# Patient Record
Sex: Male | Born: 1990 | Race: White | Hispanic: No | Marital: Single | Smoking: Current every day smoker
Health system: Southern US, Community
[De-identification: ages and names within clinical notes are randomized; demographics above are authoritative.]

## PROBLEM LIST (undated history)

## (undated) ENCOUNTER — Emergency Department (HOSPITAL_COMMUNITY): Discharge status: Absent without leave | Payer: Medicaid Other

## (undated) HISTORY — PX: SPLENECTOMY: SUR1306

---
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
## 2012-04-29 ENCOUNTER — Emergency Department (HOSPITAL_COMMUNITY): Payer: Self-pay

## 2012-04-29 ENCOUNTER — Encounter (HOSPITAL_COMMUNITY): Payer: Self-pay

## 2012-04-29 ENCOUNTER — Emergency Department (HOSPITAL_COMMUNITY)
Admission: EM | Admit: 2012-04-29 | Discharge: 2012-04-29 | Disposition: A | Discharge status: Home / Self Care | Payer: Self-pay | Attending: Emergency Medicine | Admitting: Emergency Medicine

## 2012-04-29 DIAGNOSIS — Y92410 Unspecified street and highway as the place of occurrence of the external cause: Secondary | ICD-10-CM | POA: Insufficient documentation

## 2012-04-29 DIAGNOSIS — S62101A Fracture of unspecified carpal bone, right wrist, initial encounter for closed fracture: Secondary | ICD-10-CM

## 2012-04-29 DIAGNOSIS — R000 Tachycardia, unspecified: Secondary | ICD-10-CM | POA: Insufficient documentation

## 2012-04-29 DIAGNOSIS — S52599A Other fractures of lower end of unspecified radius, initial encounter for closed fracture: Secondary | ICD-10-CM | POA: Insufficient documentation

## 2012-04-29 DIAGNOSIS — Y9389 Activity, other specified: Secondary | ICD-10-CM | POA: Insufficient documentation

## 2012-04-29 MED ORDER — HYDROCODONE-ACETAMINOPHEN 5-325 MG PO TABS: ORAL_TABLET | ORAL | Status: DC

## 2012-04-29 MED ORDER — MELOXICAM 7.5 MG PO TABS: 7.5000 mg | ORAL_TABLET | Freq: Every day | ORAL | Status: DC
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
## 2012-04-29 NOTE — ED Notes (Signed)
Patient with no complaints at this time. Respirations even and unlabored. Skin warm/dry. Discharge instructions reviewed with patient at this time. Patient given opportunity to voice concerns/ask questions. Patient discharged at this time and left Emergency Department with steady gait.   
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
## 2012-04-30 NOTE — ED Provider Notes (Signed)
Medical screening examination/treatment/procedure(s) were performed by non-physician practitioner and as supervising physician I was immediately available for consultation/collaboration.   Edsel Shives L Zani Kyllonen, MD 04/30/12 2315 

## 2012-05-01 ENCOUNTER — Ambulatory Visit (INDEPENDENT_AMBULATORY_CARE_PROVIDER_SITE_OTHER): Payer: Self-pay | Admitting: Orthopedic Surgery

## 2012-05-01 VITALS — BP 118/80 | Ht 70.0 in | Wt 163.0 lb

## 2012-05-01 DIAGNOSIS — S52599A Other fractures of lower end of unspecified radius, initial encounter for closed fracture: Secondary | ICD-10-CM

## 2012-05-01 DIAGNOSIS — S52501A Unspecified fracture of the lower end of right radius, initial encounter for closed fracture: Secondary | ICD-10-CM

## 2012-05-01 DIAGNOSIS — S52509A Unspecified fracture of the lower end of unspecified radius, initial encounter for closed fracture: Secondary | ICD-10-CM | POA: Insufficient documentation

## 2012-05-01 MED ORDER — HYDROCODONE-ACETAMINOPHEN 5-325 MG PO TABS: ORAL_TABLET | ORAL | Status: DC

## 2012-05-01 NOTE — Patient Instructions (Signed)
Keep  Cast dry   Do not get wet   If it gets wet dry with a hair dryer on low setting and call the office   

## 2012-05-01 NOTE — Progress Notes (Signed)
Patient ID: Eric Archer, male   DOB: 06-Jun-1990, 22 y.o.   MRN: [id number] Chief Complaint  Patient presents with  . Wrist Pain    Right wrist injury, DOI 04-23-12.    No past medical history on file. No past surgical history on file. History  Substance Use Topics  . Smoking status: Never Smoker   . Smokeless tobacco: Not on file  . Alcohol Use: Yes     Comment: weekly   No family history on file. None allergies none family history diabetes social history single unemployed no previous medical history no surgical history  VS BP 118/80  Ht 5\' 10"  (1.778 m)  Wt 163 lb (73.936 kg)  BMI 23.39 kg/m2  General normal appearance Mood Affect normal AAO X 3  RUE tenderness over the distal radius swelling decreased range of motion joint stable muscle tone normal skin intact LUE normal range of motion strength stability alignment and skin  Lower extremity exam  Ambulation is normal.  The right and left lower extremity:  Inspection and palpation revealed no tenderness or abnormality in alignment in the lower extremities. Range of motion is full.  Strength is grade 5.  All joints are stable.    CDV normal pulse perfusion all 4 extremities LYMPH no lymphadenopathy DTR normal without pathologic reflexes Balance normal  MDM [moderate]  New dx fracture right distal radius  X-ray read as nondisplaced fracture distal radius I interpreted as a nondisplaced fracture reading actually said not sure  Surgery or fracture    The patient  itemized bill with no fracture care, office visit, cast application post materials  Short arm cast applied come back in 4 weeks for x-rays out of plaster

## 2012-05-02 ENCOUNTER — Ambulatory Visit: Payer: Self-pay | Admitting: Orthopedic Surgery

## 2012-05-30 ENCOUNTER — Ambulatory Visit: Payer: Self-pay | Admitting: Orthopedic Surgery

## 2012-06-12 ENCOUNTER — Encounter: Payer: Self-pay | Admitting: Orthopedic Surgery

## 2012-06-12 ENCOUNTER — Ambulatory Visit (INDEPENDENT_AMBULATORY_CARE_PROVIDER_SITE_OTHER): Payer: Self-pay | Admitting: Orthopedic Surgery

## 2012-06-12 ENCOUNTER — Ambulatory Visit (INDEPENDENT_AMBULATORY_CARE_PROVIDER_SITE_OTHER): Payer: Self-pay

## 2012-06-12 VITALS — BP 120/80 | Ht 70.0 in | Wt 163.0 lb

## 2012-06-12 DIAGNOSIS — Z9089 Acquired absence of other organs: Secondary | ICD-10-CM

## 2012-06-12 DIAGNOSIS — S62101D Fracture of unspecified carpal bone, right wrist, subsequent encounter for fracture with routine healing: Secondary | ICD-10-CM

## 2012-06-12 DIAGNOSIS — S62109A Fracture of unspecified carpal bone, unspecified wrist, initial encounter for closed fracture: Secondary | ICD-10-CM | POA: Insufficient documentation

## 2012-06-12 DIAGNOSIS — S5290XD Unspecified fracture of unspecified forearm, subsequent encounter for closed fracture with routine healing: Secondary | ICD-10-CM

## 2012-06-12 DIAGNOSIS — Z9081 Acquired absence of spleen: Secondary | ICD-10-CM | POA: Insufficient documentation

## 2012-06-12 MED ORDER — OXYCODONE-ACETAMINOPHEN 7.5-325 MG PO TABS: 1.0000 | ORAL_TABLET | ORAL | Status: DC | PRN

## 2012-06-12 NOTE — Progress Notes (Signed)
Patient ID: Eric Archer, male   DOB: 07-May-1990, 22 y.o.   MRN: [id number] Chief Complaint  Patient presents with  . 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 He is need some pain medicine for that. He see her for out of plaster x-ray which shows fracture healing distal radius  Followup as needed

## 2012-06-12 NOTE — Patient Instructions (Addendum)
Gradually resume normal activities

## 2013-07-18 ENCOUNTER — Emergency Department (HOSPITAL_COMMUNITY): Payer: Medicaid Other

## 2013-07-18 ENCOUNTER — Encounter (HOSPITAL_COMMUNITY): Payer: Self-pay | Admitting: Emergency Medicine

## 2013-07-18 ENCOUNTER — Emergency Department (HOSPITAL_COMMUNITY)
Admission: EM | Admit: 2013-07-18 | Discharge: 2013-07-18 | Disposition: A | Discharge status: Home / Self Care | Payer: Medicaid Other | Attending: Emergency Medicine | Admitting: Emergency Medicine

## 2013-07-18 DIAGNOSIS — Y9367 Activity, basketball: Secondary | ICD-10-CM | POA: Insufficient documentation

## 2013-07-18 DIAGNOSIS — S20211A Contusion of right front wall of thorax, initial encounter: Secondary | ICD-10-CM

## 2013-07-18 DIAGNOSIS — Z791 Long term (current) use of non-steroidal anti-inflammatories (NSAID): Secondary | ICD-10-CM | POA: Insufficient documentation

## 2013-07-18 DIAGNOSIS — Y92838 Other recreation area as the place of occurrence of the external cause: Secondary | ICD-10-CM

## 2013-07-18 DIAGNOSIS — Y9239 Other specified sports and athletic area as the place of occurrence of the external cause: Secondary | ICD-10-CM | POA: Insufficient documentation

## 2013-07-18 DIAGNOSIS — S20219A Contusion of unspecified front wall of thorax, initial encounter: Secondary | ICD-10-CM | POA: Insufficient documentation

## 2013-07-18 DIAGNOSIS — F17200 Nicotine dependence, unspecified, uncomplicated: Secondary | ICD-10-CM | POA: Insufficient documentation

## 2013-07-18 DIAGNOSIS — IMO0002 Reserved for concepts with insufficient information to code with codable children: Secondary | ICD-10-CM | POA: Insufficient documentation

## 2013-07-18 DIAGNOSIS — R296 Repeated falls: Secondary | ICD-10-CM | POA: Insufficient documentation

## 2013-07-18 DIAGNOSIS — Z79899 Other long term (current) drug therapy: Secondary | ICD-10-CM | POA: Insufficient documentation

## 2013-07-18 MED ORDER — IBUPROFEN 800 MG PO TABS: 800.0000 mg | ORAL_TABLET | Freq: Three times a day (TID) | ORAL | Status: DC

## 2013-07-18 MED ORDER — HYDROCODONE-ACETAMINOPHEN 5-325 MG PO TABS: ORAL_TABLET | ORAL | Status: DC

## 2013-07-18 NOTE — Discharge Instructions (Signed)
Chest Contusion °A contusion is a deep bruise. Bruises happen when an injury causes bleeding under the skin. Signs of bruising include pain, puffiness (swelling), and discolored skin. The bruise may turn blue, purple, or yellow.  °HOME CARE °· Put ice on the injured area. °¨ Put ice in a plastic bag. °¨ Place a towel between the skin and the bag. °¨ Leave the ice on for 15-20 minutes at a time, 03-04 times a day for the first 48 hours. °· Only take medicine as told by your doctor. °· Rest. °· Take deep breaths (deep-breathing exercises) as told by your doctor. °· Stop smoking if you smoke. °· Do not lift objects over 5 pounds (2.3 kilograms) for 3 days or longer if told by your doctor. °GET HELP RIGHT AWAY IF:  °· You have more bruising or puffiness. °· You have pain that gets worse. °· You have trouble breathing. °· You are dizzy, weak, or pass out (faint). °· You have blood in your pee (urine) or poop (stool). °· You cough up or throw up (vomit) blood. °· Your puffiness or pain is not helped with medicines. °MAKE SURE YOU:  °· Understand these instructions. °· Will watch your condition. °· Will get help right away if you are not doing well or get worse. °Document Released: 06/30/2007 Document Revised: 10/06/2011 Document Reviewed: 07/05/2011 °ExitCare® Patient Information ©2015 [employer]. This information is not intended to replace advice given to you by your health care provider. Make sure you discuss any questions you have with your health care provider. ° °

## 2013-07-18 NOTE — ED Notes (Signed)
Pt refusing x-ray, stating "I just want to keep the bill down."  Pt just requesting physical by MD and doctor's note for work.

## 2013-07-18 NOTE — ED Notes (Signed)
Pt reports to the ED with complaint of right sided flank pain r/t falling on his side this past Sunday while playing basketball. Pt reports falling onto the ball.

## 2013-07-21 NOTE — ED Provider Notes (Signed)
CSN: [id number]     Arrival date & time 07/18/13  1821 History   First MD Initiated Contact with Patient 07/18/13 1959     Chief Complaint  Patient presents with  . Flank Pain     (Consider location/radiation/quality/duration/timing/severity/associated sxs/prior Treatment) Patient is a 23 y.o. male presenting with flank pain. The history is provided by the patient.  Flank Pain This is a new problem. The current episode started in the past 7 days. The problem occurs constantly. The problem has been unchanged. Associated symptoms include arthralgias, chest pain and joint swelling. Pertinent negatives include no abdominal pain, chills, congestion, coughing, diaphoresis, fever, headaches, nausea, neck pain, numbness, rash, swollen glands, urinary symptoms, vomiting or weakness. The symptoms are aggravated by bending, twisting and standing. He has tried nothing for the symptoms. The treatment provided no relief.   patient reports localized tenderness to the lateral right ribs.  Pain began after a mechanical fall, landed on a ball.  He states the pain is worse with movement and deep breathing.  Resolves when at rest.  He denies shortness of breath, abdominal pain, hematuria, dysuria, fever or vomiting.   History reviewed. No pertinent past medical history. Past Surgical History  Procedure Laterality Date  . Splenectomy     History reviewed. No pertinent family history. History  Substance Use Topics  . Smoking status: Current Every Day Smoker -- 0.50 packs/day    Types: Cigarettes  . Smokeless tobacco: Not on file  . Alcohol Use: Yes     Comment: weekly    Review of Systems  Constitutional: Negative for fever, chills and diaphoresis.  HENT: Negative for congestion.   Respiratory: Negative for cough, chest tightness and shortness of breath.   Cardiovascular: Positive for chest pain.  Gastrointestinal: Negative for nausea, vomiting and abdominal pain.  Genitourinary: Positive for flank  pain. Negative for dysuria, hematuria and difficulty urinating.  Musculoskeletal: Positive for arthralgias and joint swelling. Negative for back pain and neck pain.  Skin: Negative for color change, rash and wound.  Neurological: Negative for dizziness, weakness, numbness and headaches.  All other systems reviewed and are negative.     Allergies  Review of patient's allergies indicates no known allergies.  Home Medications   Prior to Admission medications   Medication Sig Start Date End Date Taking? Authorizing Provider  HYDROcodone-acetaminophen (NORCO/VICODIN) 5-325 MG per tablet 1 or 2 tabs q4h prn pain 05/01/12   Vickki HearingStanley E Harrison, MD  HYDROcodone-acetaminophen (NORCO/VICODIN) 5-325 MG per tablet Take one-two tabs po q 4-6 hrs prn pain 07/18/13   Tammy L. Triplett, PA-C  ibuprofen (ADVIL,MOTRIN) 800 MG tablet Take 1 tablet (800 mg total) by mouth 3 (three) times daily. Take with food 07/18/13   Tammy L. Triplett, PA-C  meloxicam (MOBIC) 7.5 MG tablet Take 1 tablet (7.5 mg total) by mouth daily. 04/29/12   Kathie DikeHobson M Bryant, PA-C  oxyCODONE-acetaminophen (PERCOCET) 7.5-325 MG per tablet Take 1 tablet by mouth every 4 (four) hours as needed for pain. 06/12/12   Vickki HearingStanley E Harrison, MD   BP 127/77  Pulse 110  Temp(Src) 98.2 F (36.8 C) (Oral)  Resp 16  Ht 5\' 10"  (1.778 m)  Wt 160 lb (72.576 kg)  BMI 22.96 kg/m2  SpO2 98% Physical Exam  Nursing note and vitals reviewed. Constitutional: He is oriented to person, place, and time. He appears well-developed and well-nourished. No distress.  HENT:  Head: Normocephalic and atraumatic.  Neck: Normal range of motion. Neck supple.  Cardiovascular: Normal rate, regular  rhythm, normal heart sounds and intact distal pulses.   No murmur heard. Pulmonary/Chest: Effort normal and breath sounds normal. No respiratory distress. He has no wheezes. He has no rales. He exhibits tenderness.  Localized ttp of the right lateral ribs.  No edema, bruising or  crepitus.    Abdominal: Soft. Bowel sounds are normal. He exhibits no distension and no mass. There is no tenderness. There is no rebound and no guarding.  Musculoskeletal: Normal range of motion.  Lymphadenopathy:    He has no cervical adenopathy.  Neurological: He is alert and oriented to person, place, and time. He exhibits normal muscle tone. Coordination normal.  Skin: Skin is warm and dry.    ED Course  Procedures (including critical care time) Labs Review Labs Reviewed - No data to display  Imaging Review No results found.   EKG Interpretation None      MDM   Final diagnoses:  Contusion of right chest wall, initial encounter    Rib film ordered by nursing, but pt refused.  Stating that he is trying to keep his cost down.  Lung sounds are CTA, pt ambulates with steady gait.  No guarding or abdominal pain on exam, no dyspnea.  Pt is well appearing.  He agrees to symptomatic treatment with vicodin and ibuprofen.  I have advised him to f/u with Dr. Romeo AppleHarrison for recheck or to return here for any sudden, worsening symptoms.  Pt agrees to care plan and verbalized understanding.      Tammy L. Trisha Mangleriplett, PA-C 07/21/13 2058

## 2013-07-22 NOTE — ED Provider Notes (Signed)
Medical screening examination/treatment/procedure(s) were performed by non-physician practitioner and as supervising physician I was immediately available for consultation/collaboration.   EKG Interpretation None        Layla MawKristen N Ward, DO 07/22/13 [phone]

## 2013-08-28 ENCOUNTER — Emergency Department (HOSPITAL_COMMUNITY)
Admission: EM | Admit: 2013-08-28 | Discharge: 2013-08-29 | Disposition: A | Discharge status: Home / Self Care | Payer: Medicaid Other | Attending: Emergency Medicine | Admitting: Emergency Medicine

## 2013-08-28 ENCOUNTER — Encounter (HOSPITAL_COMMUNITY): Payer: Self-pay | Admitting: Emergency Medicine

## 2013-08-28 ENCOUNTER — Emergency Department (HOSPITAL_COMMUNITY): Payer: Medicaid Other

## 2013-08-28 DIAGNOSIS — IMO0002 Reserved for concepts with insufficient information to code with codable children: Secondary | ICD-10-CM | POA: Diagnosis not present

## 2013-08-28 DIAGNOSIS — Z79899 Other long term (current) drug therapy: Secondary | ICD-10-CM | POA: Diagnosis not present

## 2013-08-28 DIAGNOSIS — M5412 Radiculopathy, cervical region: Secondary | ICD-10-CM | POA: Diagnosis not present

## 2013-08-28 DIAGNOSIS — M542 Cervicalgia: Secondary | ICD-10-CM | POA: Diagnosis present

## 2013-08-28 DIAGNOSIS — Z791 Long term (current) use of non-steroidal anti-inflammatories (NSAID): Secondary | ICD-10-CM | POA: Diagnosis not present

## 2013-08-28 DIAGNOSIS — M62838 Other muscle spasm: Secondary | ICD-10-CM | POA: Diagnosis not present

## 2013-08-28 DIAGNOSIS — F17200 Nicotine dependence, unspecified, uncomplicated: Secondary | ICD-10-CM | POA: Insufficient documentation

## 2013-08-28 NOTE — ED Notes (Signed)
Pain post neck with pain down rt arm with numbness of rt hand for 6 days, No know injury

## 2013-08-29 MED ORDER — PREDNISONE 10 MG PO TABS: ORAL_TABLET | ORAL | Status: DC

## 2013-08-29 MED ORDER — METHOCARBAMOL 500 MG PO TABS
1000.0000 mg | ORAL_TABLET | Freq: Once | ORAL | Status: AC
  Administered 2013-08-29: 1000 mg via ORAL
  Filled 2013-08-29: qty 2

## 2013-08-29 MED ORDER — PREDNISONE 50 MG PO TABS
60.0000 mg | ORAL_TABLET | Freq: Once | ORAL | Status: AC
  Administered 2013-08-29: 60 mg via ORAL
  Filled 2013-08-29 (×2): qty 1

## 2013-08-29 MED ORDER — METHOCARBAMOL 500 MG PO TABS: 1000.0000 mg | ORAL_TABLET | Freq: Four times a day (QID) | ORAL | Status: AC

## 2013-08-29 NOTE — ED Notes (Signed)
Patient given discharge instruction, verbalized understand. Patient ambulatory out of the department.  

## 2013-08-29 NOTE — Discharge Instructions (Signed)
Cervical Radiculopathy Cervical radiculopathy happens when a nerve in the neck is pinched or bruised by a slipped (herniated) disk or by arthritic changes in the bones of the cervical spine. This can occur due to an injury or as part of the normal aging process. Pressure on the cervical nerves can cause pain or numbness that runs from your neck all the way down into your arm and fingers. CAUSES  There are many possible causes, including:  Injury.  Muscle tightness in the neck from overuse.  Swollen, painful joints (arthritis).  Breakdown or degeneration in the bones and joints of the spine (spondylosis) due to aging.  Bone spurs that may develop near the cervical nerves. SYMPTOMS  Symptoms include pain, weakness, or numbness in the affected arm and hand. Pain can be severe or irritating. Symptoms may be worse when extending or turning the neck. DIAGNOSIS  Your caregiver will ask about your symptoms and do a physical exam. He or she may test your strength and reflexes. X-rays, CT scans, and MRI scans may be needed in cases of injury or if the symptoms do not go away after a period of time. Electromyography (EMG) or nerve conduction testing may be done to study how your nerves and muscles are working. TREATMENT  Your caregiver may recommend certain exercises to help relieve your symptoms. Cervical radiculopathy can, and often does, get better with time and treatment. If your problems continue, treatment options may include:  Wearing a soft collar for short periods of time.  Physical therapy to strengthen the neck muscles.  Medicines, such as nonsteroidal anti-inflammatory drugs (NSAIDs), oral corticosteroids, or spinal injections.  Surgery. Different types of surgery may be done depending on the cause of your problems. HOME CARE INSTRUCTIONS   Put ice on the affected area.  Put ice in a plastic bag.  Place a towel between your skin and the bag.  Leave the ice on for 15-20 minutes,  03-04 times a day or as directed by your caregiver.  If ice does not help, you can try using heat. Take a warm shower or bath, or use a hot water bottle as directed by your caregiver.  You may try a gentle neck and shoulder massage.  Use a flat pillow when you sleep.  Only take over-the-counter or prescription medicines for pain, discomfort, or fever as directed by your caregiver.  If physical therapy was prescribed, follow your caregiver's directions.  If a soft collar was prescribed, use it as directed. SEEK IMMEDIATE MEDICAL CARE IF:   Your pain gets much worse and cannot be controlled with medicines.  You have weakness or numbness in your hand, arm, face, or leg.  You have a high fever or a stiff, rigid neck.  You lose bowel or bladder control (incontinence).  You have trouble with walking, balance, or speaking. MAKE SURE YOU:   Understand these instructions.  Will watch your condition.  Will get help right away if you are not doing well or get worse. Document Released: 10/06/2000 Document Revised: 04/05/2011 Document Reviewed: 08/25/2010 [employer] Patient Information 2015 [employer], [place]. This information is not intended to replace advice given to you by your health care provider. Make sure you discuss any questions you have with your health care provider.   Take your next dose of prednisone tomorrow evening.  Apply a heating pad (or hot shower) 15 minutes 3 times daily to help relax your shoulder and neck muscles.
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
## 2013-09-01 NOTE — ED Provider Notes (Signed)
Medical screening examination/treatment/procedure(s) were performed by non-physician practitioner and as supervising physician I was immediately available for consultation/collaboration.   EKG Interpretation None        Keylee Shrestha M Breylin Dom, MD 09/01/13 1105 

## 2014-12-10 ENCOUNTER — Emergency Department (HOSPITAL_COMMUNITY)
Admission: EM | Admit: 2014-12-10 | Discharge: 2014-12-10 | Disposition: A | Discharge status: Home / Self Care | Payer: Medicaid Other | Attending: Emergency Medicine | Admitting: Emergency Medicine

## 2014-12-10 ENCOUNTER — Encounter (HOSPITAL_COMMUNITY): Payer: Self-pay | Admitting: Emergency Medicine

## 2014-12-10 DIAGNOSIS — R6884 Jaw pain: Secondary | ICD-10-CM | POA: Insufficient documentation

## 2014-12-10 DIAGNOSIS — R531 Weakness: Secondary | ICD-10-CM | POA: Insufficient documentation

## 2014-12-10 DIAGNOSIS — M542 Cervicalgia: Secondary | ICD-10-CM | POA: Insufficient documentation

## 2014-12-10 DIAGNOSIS — F17210 Nicotine dependence, cigarettes, uncomplicated: Secondary | ICD-10-CM | POA: Insufficient documentation

## 2014-12-10 DIAGNOSIS — Z791 Long term (current) use of non-steroidal anti-inflammatories (NSAID): Secondary | ICD-10-CM | POA: Insufficient documentation

## 2014-12-10 DIAGNOSIS — G510 Bell's palsy: Secondary | ICD-10-CM | POA: Insufficient documentation

## 2014-12-10 LAB — CBC WITH DIFFERENTIAL/PLATELET
BASOS PCT: 0 %
Basophils Absolute: 0 10*3/uL (ref 0.0–0.1)
EOS ABS: 0.1 10*3/uL (ref 0.0–0.7)
Eosinophils Relative: 1 %
HCT: 42.6 % (ref 39.0–52.0)
HEMOGLOBIN: 14.9 g/dL (ref 13.0–17.0)
LYMPHS ABS: 5.3 10*3/uL — AB (ref 0.7–4.0)
Lymphocytes Relative: 49 %
MCH: 31.4 pg (ref 26.0–34.0)
MCHC: 35 g/dL (ref 30.0–36.0)
MCV: 89.7 fL (ref 78.0–100.0)
MONO ABS: 1.1 10*3/uL — AB (ref 0.1–1.0)
MONOS PCT: 10 %
NEUTROS PCT: 40 %
Neutro Abs: 4.3 10*3/uL (ref 1.7–7.7)
Platelets: 481 10*3/uL — ABNORMAL HIGH (ref 150–400)
RBC: 4.75 MIL/uL (ref 4.22–5.81)
RDW: 13 % (ref 11.5–15.5)
WBC: 10.9 10*3/uL — ABNORMAL HIGH (ref 4.0–10.5)

## 2014-12-10 LAB — BASIC METABOLIC PANEL
Anion gap: 10 (ref 5–15)
BUN: 10 mg/dL (ref 6–20)
CALCIUM: 9.6 mg/dL (ref 8.9–10.3)
CHLORIDE: 101 mmol/L (ref 101–111)
CO2: 28 mmol/L (ref 22–32)
CREATININE: 0.87 mg/dL (ref 0.61–1.24)
GFR calc non Af Amer: >60 mL/min (ref >60)
Glucose, Bld: 103 mg/dL — ABNORMAL HIGH (ref 65–99)
Potassium: 3.8 mmol/L (ref 3.5–5.1)
SODIUM: 139 mmol/L (ref 135–145)

## 2014-12-10 MED ORDER — ARTIFICIAL TEARS OP OINT
TOPICAL_OINTMENT | OPHTHALMIC | Status: AC
  Filled 2014-12-10: qty 3.5

## 2014-12-10 MED ORDER — ARTIFICIAL TEARS OP OINT
TOPICAL_OINTMENT | OPHTHALMIC | Status: DC | PRN
  Administered 2014-12-10: 18:00:00 via OPHTHALMIC
  Filled 2014-12-10: qty 3.5

## 2014-12-10 MED ORDER — VALACYCLOVIR HCL 1 G PO TABS: 1000.0000 mg | ORAL_TABLET | Freq: Three times a day (TID) | ORAL | Status: DC

## 2014-12-10 MED ORDER — PREDNISONE 50 MG PO TABS
60.0000 mg | ORAL_TABLET | Freq: Once | ORAL | Status: AC
  Administered 2014-12-10: 60 mg via ORAL
  Filled 2014-12-10 (×2): qty 1

## 2014-12-10 MED ORDER — PREDNISONE 20 MG PO TABS: 40.0000 mg | ORAL_TABLET | Freq: Every day | ORAL | Status: DC

## 2014-12-10 MED ORDER — VALACYCLOVIR HCL 500 MG PO TABS
1000.0000 mg | ORAL_TABLET | Freq: Once | ORAL | Status: AC
  Administered 2014-12-10: 1000 mg via ORAL
  Filled 2014-12-10: qty 2

## 2014-12-10 NOTE — ED Notes (Signed)
Pt states that he was sent from the health dept for evaluation of left sided facial swelling and numbness.  Also c/o pain just behind jaw.

## 2014-12-10 NOTE — Discharge Instructions (Signed)
Bell Palsy °Bell palsy is a condition in which the muscles on one side of the face become paralyzed. This often causes one side of the face to droop. It is a common condition and most people recover completely. °RISK FACTORS °Risk factors for Bell palsy include: °· Pregnancy. °· Diabetes. °· An infection by a virus, such as infections that cause cold sores. °CAUSES  °Bell palsy is caused by damage to or inflammation of a nerve in your face. It is unclear why this happens, but an infection by a virus may lead to it. Most of the time the reason it happens is unknown. °SIGNS AND SYMPTOMS  °Symptoms can range from mild to severe and can take place over a number of hours. Symptoms may include: °· Being unable to: °¨ Raise one or both eyebrows. °¨ Close one or both eyes. °¨ Feel parts of your face (facial numbness). °· Drooping of the eyelid and corner of the mouth. °· Weakness in the face. °· Paralysis of half your face. °· Loss of taste. °· Sensitivity to loud noises. °· Difficulty chewing. °· Tearing up of the affected eye. °· Dryness in the affected eye. °· Drooling. °· Pain behind one ear. °DIAGNOSIS  °Diagnosis of Bell palsy may include: °· A medical history and physical exam. °· An MRI. °· A CT scan. °· Electromyography (EMG). This is a test that checks how your nerves are working. °TREATMENT  °Treatment may include antiviral medicine to help shorten the length of the condition. Sometimes treatment is not needed and the symptoms go away on their own. °HOME CARE INSTRUCTIONS  °· Take medicines only as directed by your health care provider. °· Do facial massages and exercises as directed by your health care provider. °· If your eye is affected: °¨ Use moisturizing eye drops to prevent drying of your eye as directed by your health care provider. °¨ Protect your eye as directed by your health care provider. °SEEK MEDICAL CARE IF: °· Your symptoms do not get better or get worse. °· You are drooling. °· Your eye is red,  irritated, or hurts. °SEEK IMMEDIATE MEDICAL CARE IF:  °· Another part of your body feels weak or numb. °· You have difficulty swallowing. °· You have a fever along with symptoms of Bell palsy. °· You develop neck pain. °MAKE SURE YOU:  °· Understand these instructions. °· Will watch your condition. °· Will get help right away if you are not doing well or get worse. °  °This information is not intended to replace advice given to you by your health care provider. Make sure you discuss any questions you have with your health care provider. °  °Document Released: 01/11/2005 Document Revised: 10/02/2014 Document Reviewed: 04/20/2013 °Elsevier Interactive Patient Education ©2016 [employer] ° °

## 2014-12-10 NOTE — ED Provider Notes (Signed)
CSN: [id number]     Arrival date & time 12/10/14  1517 History   First MD Initiated Contact with Patient 12/10/14 1743     Chief Complaint  Patient presents with  . Facial Swelling     (Consider location/radiation/quality/duration/timing/severity/associated sxs/prior Treatment) The history is provided by the patient.   patient presents with difficulty moving the left side of his face. Began last night. Some mild pain in his left neck. States he had some drooling earlier today. States he feels as if there is a mild pain behind his left jaw. States he does have a bad tooth in his left lower jaw. No vision changes. States that around 2 weeks ago he did have a viral infection nausea and vomiting. States that around this time each year he also gets ulcers in his mouth.   History reviewed. No pertinent past medical history. Past Surgical History  Procedure Laterality Date  . Splenectomy     History reviewed. No pertinent family history. Social History  Substance Use Topics  . Smoking status: Current Every Day Smoker -- 0.50 packs/day    Types: Cigarettes  . Smokeless tobacco: None  . Alcohol Use: Yes     Comment: weekly    Review of Systems  Constitutional: Negative for activity change and appetite change.  Eyes: Negative for pain.  Respiratory: Negative for choking, chest tightness and shortness of breath.   Cardiovascular: Negative for chest pain and leg swelling.  Gastrointestinal: Negative for nausea, vomiting, abdominal pain and diarrhea.  Genitourinary: Negative for flank pain.  Musculoskeletal: Negative for back pain and neck stiffness.  Skin: Negative for rash.  Neurological: Positive for weakness. Negative for numbness and headaches.  Psychiatric/Behavioral: Negative for behavioral problems.      Allergies  Review of patient's allergies indicates no known allergies.  Home Medications   Prior to Admission medications   Medication Sig Start Date End Date Taking?  Authorizing Provider  HYDROcodone-acetaminophen (NORCO/VICODIN) 5-325 MG per tablet 1 or 2 tabs q4h prn pain 05/01/12   Vickki Hearing, MD  HYDROcodone-acetaminophen (NORCO/VICODIN) 5-325 MG per tablet Take one-two tabs po q 4-6 hrs prn pain 07/18/13   Tammy Triplett, PA-C  ibuprofen (ADVIL,MOTRIN) 800 MG tablet Take 1 tablet (800 mg total) by mouth 3 (three) times daily. Take with food 07/18/13   Tammy Triplett, PA-C  meloxicam (MOBIC) 7.5 MG tablet Take 1 tablet (7.5 mg total) by mouth daily. 04/29/12   Ivery Quale, PA-C  oxyCODONE-acetaminophen (PERCOCET) 7.5-325 MG per tablet Take 1 tablet by mouth every 4 (four) hours as needed for pain. 06/12/12   Vickki Hearing, MD  predniSONE (DELTASONE) 20 MG tablet Take 2 tablets (40 mg total) by mouth daily. 12/10/14   Benjiman Core, MD  valACYclovir (VALTREX) 1000 MG tablet Take 1 tablet (1,000 mg total) by mouth 3 (three) times daily. 12/10/14   Benjiman Core, MD   BP 130/87 mmHg  Pulse 97  Temp(Src) 98.2 F (36.8 C) (Oral)  Resp 18  Ht  (1.778 m)  Wt 178 lb (80.74 kg)  BMI 25.54 kg/m2  SpO2 100% Physical Exam  Constitutional: He appears well-developed and well-nourished.  HENT:  Head: Atraumatic.  Eyes: EOM are normal. Pupils are equal, round, and reactive to light.  Neck: Neck supple.  Mild tenderness behind jaw on left side.  Cardiovascular: Normal rate.   Pulmonary/Chest: Effort normal.  Abdominal: Soft.  Neurological: He is alert.  A left-sided facial droop. Forehead is involved. External ocular movements intact. Difficulty  closing the left eye.    ED Course  Procedures (including critical care time) Labs Review Labs Reviewed  CBC WITH DIFFERENTIAL/PLATELET - Abnormal; Notable for the following:    WBC 10.9 (*)    Platelets 481 (*)    Lymphs Abs 5.3 (*)    Monocytes Absolute 1.1 (*)    All other components within normal limits  BASIC METABOLIC PANEL - Abnormal; Notable for the following:    Glucose, Bld 103  (*)    All other components within normal limits    Imaging Review No results found. I have personally reviewed and evaluated these images and lab results as part of my medical decision-making.   EKG Interpretation None      MDM   Final diagnoses:  Bell's palsy   patient with most likely Bell's palsy. Left facial droop with forehead involvement. This time year reportedly always gets ulcers in his mouth although he does not have now. Will treat with valacyclovir and sterile it's. Follow-up as needed. Other central cause of facial droop is less likely.    Benjiman CoreNathan Jovan Colligan, MD 12/10/14 [phone]

## 2015-06-09 ENCOUNTER — Encounter (HOSPITAL_COMMUNITY): Payer: Self-pay

## 2015-06-09 ENCOUNTER — Emergency Department (HOSPITAL_COMMUNITY)
Admission: EM | Admit: 2015-06-09 | Discharge: 2015-06-09 | Disposition: A | Discharge status: Home / Self Care | Payer: Medicaid Other | Attending: Emergency Medicine | Admitting: Emergency Medicine

## 2015-06-09 DIAGNOSIS — Y939 Activity, unspecified: Secondary | ICD-10-CM | POA: Insufficient documentation

## 2015-06-09 DIAGNOSIS — F17210 Nicotine dependence, cigarettes, uncomplicated: Secondary | ICD-10-CM | POA: Insufficient documentation

## 2015-06-09 DIAGNOSIS — W57XXXA Bitten or stung by nonvenomous insect and other nonvenomous arthropods, initial encounter: Secondary | ICD-10-CM | POA: Insufficient documentation

## 2015-06-09 DIAGNOSIS — Y999 Unspecified external cause status: Secondary | ICD-10-CM | POA: Insufficient documentation

## 2015-06-09 DIAGNOSIS — S80861A Insect bite (nonvenomous), right lower leg, initial encounter: Secondary | ICD-10-CM | POA: Insufficient documentation

## 2015-06-09 DIAGNOSIS — Y929 Unspecified place or not applicable: Secondary | ICD-10-CM | POA: Insufficient documentation

## 2015-06-09 DIAGNOSIS — R112 Nausea with vomiting, unspecified: Secondary | ICD-10-CM | POA: Insufficient documentation

## 2015-06-09 MED ORDER — ONDANSETRON HCL 4 MG PO TABS
4.0000 mg | ORAL_TABLET | Freq: Once | ORAL | Status: AC
Start: 2015-06-09 — End: 2015-06-09
  Administered 2015-06-09: 4 mg via ORAL
  Filled 2015-06-09: qty 1

## 2015-06-09 MED ORDER — ACETAMINOPHEN-CODEINE #3 300-30 MG PO TABS: 1.0000 | ORAL_TABLET | Freq: Four times a day (QID) | ORAL | Status: DC | PRN

## 2015-06-09 MED ORDER — IBUPROFEN 800 MG PO TABS
800.0000 mg | ORAL_TABLET | Freq: Once | ORAL | Status: AC
Start: 2015-06-09 — End: 2015-06-09
  Administered 2015-06-09: 800 mg via ORAL
  Filled 2015-06-09: qty 1

## 2015-06-09 MED ORDER — PROMETHAZINE HCL 12.5 MG PO TABS: 12.5000 mg | ORAL_TABLET | Freq: Four times a day (QID) | ORAL | Status: DC | PRN

## 2015-06-09 MED ORDER — SULFAMETHOXAZOLE-TRIMETHOPRIM 800-160 MG PO TABS
1.0000 | ORAL_TABLET | Freq: Once | ORAL | Status: AC
  Administered 2015-06-09: 1 via ORAL
  Filled 2015-06-09: qty 1

## 2015-06-09 MED ORDER — SULFAMETHOXAZOLE-TRIMETHOPRIM 800-160 MG PO TABS: 1.0000 | ORAL_TABLET | Freq: Two times a day (BID) | ORAL | Status: AC

## 2015-06-09 NOTE — ED Provider Notes (Signed)
CSN: [id number]     Arrival date & time 06/09/15  1159 History  By signing my name below, I, Placido SouLogan Joldersma, attest that this documentation has been prepared under the direction and in the presence of Ivery QualeHobson Walt Geathers, PA-C. Electronically Signed: Placido SouLogan Joldersma, ED Scribe. 06/09/2015. 2:12 PM.   Chief Complaint  Patient presents with  . Insect Bite   Patient is a 25 y.o. male presenting with animal bite. The history is provided by the patient. No language interpreter was used.  Animal Bite Contact animal:  Insect Location:  Leg Leg injury location:  R lower leg Time since incident:  2 days Pain details:    Severity:  Mild   Timing:  Constant   Progression:  Unchanged Incident location:  Another residence Relieved by:  None tried Ineffective treatments:  None tried Associated symptoms: no fever and no rash     HPI Comments: Eric Archer is a 25 y.o. male who presents to the Emergency Department complaining of a possible insect bite to his right anterior calf x 2 days. Pt is unsure if he was bit by an insect noting that he first saw the affected region after sleeping on an air mattress at a friend's home. He reports associated, intermittent, mild, n/v, decreased appetite and mild, diffuse, body aches. Pt denies fevers.   History reviewed. No pertinent past medical history. Past Surgical History  Procedure Laterality Date  . Splenectomy     No family history on file. Social History  Substance Use Topics  . Smoking status: Current Every Day Smoker -- 0.50 packs/day    Types: Cigarettes  . Smokeless tobacco: None  . Alcohol Use: Yes     Comment: weekly    Review of Systems  Constitutional: Positive for appetite change. Negative for fever.  Gastrointestinal: Positive for nausea and vomiting.  Musculoskeletal: Positive for myalgias.  Skin: Positive for wound. Negative for rash.  All other systems reviewed and are negative.   Allergies  Review of patient's  allergies indicates no known allergies.  Home Medications   Prior to Admission medications   Medication Sig Start Date End Date Taking? Authorizing Provider  HYDROcodone-acetaminophen (NORCO/VICODIN) 5-325 MG per tablet 1 or 2 tabs q4h prn pain 05/01/12   Vickki HearingStanley E Harrison, MD  HYDROcodone-acetaminophen (NORCO/VICODIN) 5-325 MG per tablet Take one-two tabs po q 4-6 hrs prn pain 07/18/13   Tammy Triplett, PA-C  ibuprofen (ADVIL,MOTRIN) 800 MG tablet Take 1 tablet (800 mg total) by mouth 3 (three) times daily. Take with food 07/18/13   Tammy Triplett, PA-C  meloxicam (MOBIC) 7.5 MG tablet Take 1 tablet (7.5 mg total) by mouth daily. 04/29/12   Ivery QualeHobson Shmuel Girgis, PA-C  oxyCODONE-acetaminophen (PERCOCET) 7.5-325 MG per tablet Take 1 tablet by mouth every 4 (four) hours as needed for pain. 06/12/12   Vickki HearingStanley E Harrison, MD  predniSONE (DELTASONE) 20 MG tablet Take 2 tablets (40 mg total) by mouth daily. 12/10/14   Benjiman CoreNathan Pickering, MD  valACYclovir (VALTREX) 1000 MG tablet Take 1 tablet (1,000 mg total) by mouth 3 (three) times daily. 12/10/14   Benjiman CoreNathan Pickering, MD   BP 138/93 mmHg  Pulse 81  Temp(Src) 98.2 F (36.8 C) (Oral)  Resp 16  Ht 5\' 10"  (1.778 m)  Wt 180 lb (81.647 kg)  BMI 25.83 kg/m2  SpO2 100%    Physical Exam  Constitutional: He is oriented to person, place, and time. He appears well-developed and well-nourished.  HENT:  Head: Normocephalic and atraumatic.  No swelling of  the tongue. Airway is patent.   Eyes: EOM are normal.  Neck: Normal range of motion.  Cardiovascular: Normal rate, regular rhythm, normal heart sounds and intact distal pulses.  Exam reveals no gallop and no friction rub.   No murmur heard. Pulmonary/Chest: Effort normal and breath sounds normal. No respiratory distress. He has no wheezes. He has no rales. He exhibits no tenderness.  Abdominal: Soft.  Musculoskeletal: Normal range of motion.  Scabbed area of the anterior tibial portion of the RLE. No red streaks  present. No temperature changes. No satellite abscess noted. No active drainage. FROM of the right ankle. Right achilles tendon is intact.   Neurological: He is alert and oriented to person, place, and time.  Skin: Skin is warm and dry.  Psychiatric: He has a normal mood and affect.  Nursing note and vitals reviewed.   ED Course  Procedures  DIAGNOSTIC STUDIES: Oxygen Saturation is 100% on RA, normal by my interpretation.    COORDINATION OF CARE: 2:08 PM Discussed next steps with pt including bactrim, ibuprofen, promethazine and Tylenol #3. He verbalized understanding and is agreeable with the plan.   Labs Review Labs Reviewed - No data to display  Imaging Review No results found.   EKG Interpretation None      MDM  The patient has a bite to the right lower extremity. There is no red streaks, and no active drainage present. The patient, complains of severe pain. The patient was offered Bactrim and ibuprofen, but insisted on something stronger for pain. Patient will be treated with Tylenol codeine. Patient treated with promethazine for nausea. Patient is in agreement with the discharge plan. He will follow-up with his primary physician.    Final diagnoses:  Insect bite    **I have reviewed nursing notes, vital signs, and all appropriate lab and imaging results for this patient.*  **I personally performed the services described in this documentation, which was scribed in my presence. The recorded information has been reviewed and is accurate.Ivery Quale, PA-C 06/09/15 2124  Geoffery Lyons, MD 06/10/15 [phone]

## 2015-06-09 NOTE — ED Notes (Signed)
Pt reports an insect bite to right lower leg since Saturday morning. Complaining of generalized body aches since he noticed area. Reports some N/V/D

## 2015-06-09 NOTE — ED Notes (Signed)
Pt verbalized understanding of no driving and to use caution within 4 hours of taking pain meds due to meds cause drowsiness 

## 2015-06-09 NOTE — ED Notes (Signed)
Pt with small area that started out as a pimple per pt since Saturday morning, pt states that he vomited every morning since and nausea throughout the day

## 2015-06-09 NOTE — Discharge Instructions (Signed)
Please use warm tub soaks daily. Use tylenol or ibuprofen for mild pain. Use bactrim 2 daily with food. Use phenergan for nausea. Phenergan and tylenol codeine may cause drowsiness, use with caution.

## 2016-12-19 ENCOUNTER — Other Ambulatory Visit: Payer: Self-pay

## 2016-12-19 ENCOUNTER — Emergency Department (HOSPITAL_COMMUNITY): Payer: No Typology Code available for payment source

## 2016-12-19 ENCOUNTER — Emergency Department (HOSPITAL_COMMUNITY)
Admission: EM | Admit: 2016-12-19 | Discharge: 2016-12-19 | Disposition: A | Discharge status: Home / Self Care | Payer: No Typology Code available for payment source | Attending: Emergency Medicine | Admitting: Emergency Medicine

## 2016-12-19 ENCOUNTER — Encounter (HOSPITAL_COMMUNITY): Payer: Self-pay

## 2016-12-19 DIAGNOSIS — R51 Headache: Secondary | ICD-10-CM | POA: Insufficient documentation

## 2016-12-19 DIAGNOSIS — M25552 Pain in left hip: Secondary | ICD-10-CM | POA: Insufficient documentation

## 2016-12-19 DIAGNOSIS — M542 Cervicalgia: Secondary | ICD-10-CM | POA: Insufficient documentation

## 2016-12-19 DIAGNOSIS — M7918 Myalgia, other site: Secondary | ICD-10-CM | POA: Diagnosis not present

## 2016-12-19 DIAGNOSIS — F17210 Nicotine dependence, cigarettes, uncomplicated: Secondary | ICD-10-CM | POA: Diagnosis not present

## 2016-12-19 DIAGNOSIS — Z79899 Other long term (current) drug therapy: Secondary | ICD-10-CM | POA: Diagnosis not present

## 2016-12-19 DIAGNOSIS — M545 Low back pain, unspecified: Secondary | ICD-10-CM

## 2016-12-19 MED ORDER — TRAMADOL HCL 50 MG PO TABS
50.0000 mg | ORAL_TABLET | Freq: Once | ORAL | Status: DC
  Filled 2016-12-19: qty 1

## 2016-12-19 MED ORDER — IBUPROFEN 400 MG PO TABS
400.0000 mg | ORAL_TABLET | Freq: Once | ORAL | Status: AC
  Administered 2016-12-19: 400 mg via ORAL
  Filled 2016-12-19: qty 1

## 2016-12-19 MED ORDER — NAPROXEN 250 MG PO TABS: 250.0000 mg | ORAL_TABLET | Freq: Two times a day (BID) | ORAL | 0 refills | Status: DC | PRN

## 2016-12-19 MED ORDER — METHOCARBAMOL 500 MG PO TABS: 1000.0000 mg | ORAL_TABLET | Freq: Four times a day (QID) | ORAL | 0 refills | Status: DC | PRN

## 2016-12-19 MED ORDER — TRAMADOL HCL 50 MG PO TABS: 50.0000 mg | ORAL_TABLET | Freq: Four times a day (QID) | ORAL | 0 refills | Status: DC | PRN

## 2016-12-19 NOTE — Discharge Instructions (Signed)
Take the prescriptions as directed.  Apply moist heat or ice to the area(s) of discomfort, for 15 minutes at a time, several times per day for the next few days.  Do not fall asleep on a heating or ice pack.  Call your regular medical doctor tomorrow to schedule a follow up appointment this week.  Return to the Emergency Department immediately if worsening. ° °
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
## 2018-10-04 ENCOUNTER — Other Ambulatory Visit: Payer: Self-pay

## 2018-10-04 ENCOUNTER — Emergency Department (HOSPITAL_COMMUNITY): Payer: Medicaid Other

## 2018-10-04 ENCOUNTER — Inpatient Hospital Stay (HOSPITAL_COMMUNITY): Payer: Medicaid Other

## 2018-10-04 ENCOUNTER — Inpatient Hospital Stay (HOSPITAL_COMMUNITY)
Admission: EM | Admit: 2018-10-04 | Discharge: 2018-10-26 | DRG: 308 | Disposition: E | Payer: Medicaid Other | Attending: Pulmonary Disease | Admitting: Pulmonary Disease

## 2018-10-04 DIAGNOSIS — L89811 Pressure ulcer of head, stage 1: Secondary | ICD-10-CM | POA: Diagnosis not present

## 2018-10-04 DIAGNOSIS — R740 Nonspecific elevation of levels of transaminase and lactic acid dehydrogenase [LDH]: Secondary | ICD-10-CM | POA: Diagnosis present

## 2018-10-04 DIAGNOSIS — R680 Hypothermia, not associated with low environmental temperature: Secondary | ICD-10-CM | POA: Diagnosis present

## 2018-10-04 DIAGNOSIS — Z66 Do not resuscitate: Secondary | ICD-10-CM | POA: Diagnosis not present

## 2018-10-04 DIAGNOSIS — R739 Hyperglycemia, unspecified: Secondary | ICD-10-CM | POA: Diagnosis not present

## 2018-10-04 DIAGNOSIS — F17210 Nicotine dependence, cigarettes, uncomplicated: Secondary | ICD-10-CM | POA: Diagnosis present

## 2018-10-04 DIAGNOSIS — G931 Anoxic brain damage, not elsewhere classified: Secondary | ICD-10-CM

## 2018-10-04 DIAGNOSIS — J80 Acute respiratory distress syndrome: Secondary | ICD-10-CM

## 2018-10-04 DIAGNOSIS — Y907 Blood alcohol level of 200-239 mg/100 ml: Secondary | ICD-10-CM | POA: Diagnosis present

## 2018-10-04 DIAGNOSIS — F10129 Alcohol abuse with intoxication, unspecified: Secondary | ICD-10-CM | POA: Diagnosis present

## 2018-10-04 DIAGNOSIS — J690 Pneumonitis due to inhalation of food and vomit: Secondary | ICD-10-CM | POA: Diagnosis present

## 2018-10-04 DIAGNOSIS — E232 Diabetes insipidus: Secondary | ICD-10-CM | POA: Diagnosis not present

## 2018-10-04 DIAGNOSIS — R7981 Abnormal blood-gas level: Secondary | ICD-10-CM

## 2018-10-04 DIAGNOSIS — J9602 Acute respiratory failure with hypercapnia: Secondary | ICD-10-CM | POA: Diagnosis present

## 2018-10-04 DIAGNOSIS — N179 Acute kidney failure, unspecified: Secondary | ICD-10-CM | POA: Diagnosis present

## 2018-10-04 DIAGNOSIS — Z9081 Acquired absence of spleen: Secondary | ICD-10-CM

## 2018-10-04 DIAGNOSIS — J9601 Acute respiratory failure with hypoxia: Secondary | ICD-10-CM | POA: Diagnosis present

## 2018-10-04 DIAGNOSIS — I420 Dilated cardiomyopathy: Secondary | ICD-10-CM | POA: Diagnosis present

## 2018-10-04 DIAGNOSIS — J9600 Acute respiratory failure, unspecified whether with hypoxia or hypercapnia: Secondary | ICD-10-CM

## 2018-10-04 DIAGNOSIS — R001 Bradycardia, unspecified: Secondary | ICD-10-CM | POA: Diagnosis present

## 2018-10-04 DIAGNOSIS — I462 Cardiac arrest due to underlying cardiac condition: Secondary | ICD-10-CM | POA: Diagnosis not present

## 2018-10-04 DIAGNOSIS — F1310 Sedative, hypnotic or anxiolytic abuse, uncomplicated: Secondary | ICD-10-CM | POA: Diagnosis present

## 2018-10-04 DIAGNOSIS — Z9911 Dependence on respirator [ventilator] status: Secondary | ICD-10-CM | POA: Diagnosis not present

## 2018-10-04 DIAGNOSIS — I959 Hypotension, unspecified: Secondary | ICD-10-CM | POA: Diagnosis not present

## 2018-10-04 DIAGNOSIS — Z452 Encounter for adjustment and management of vascular access device: Secondary | ICD-10-CM

## 2018-10-04 DIAGNOSIS — I5021 Acute systolic (congestive) heart failure: Secondary | ICD-10-CM

## 2018-10-04 DIAGNOSIS — A0472 Enterocolitis due to Clostridium difficile, not specified as recurrent: Secondary | ICD-10-CM | POA: Diagnosis not present

## 2018-10-04 DIAGNOSIS — I110 Hypertensive heart disease with heart failure: Secondary | ICD-10-CM | POA: Diagnosis not present

## 2018-10-04 DIAGNOSIS — L8990 Pressure ulcer of unspecified site, unspecified stage: Secondary | ICD-10-CM | POA: Insufficient documentation

## 2018-10-04 DIAGNOSIS — R34 Anuria and oliguria: Secondary | ICD-10-CM | POA: Diagnosis not present

## 2018-10-04 DIAGNOSIS — Z20828 Contact with and (suspected) exposure to other viral communicable diseases: Secondary | ICD-10-CM | POA: Diagnosis present

## 2018-10-04 DIAGNOSIS — Z515 Encounter for palliative care: Secondary | ICD-10-CM

## 2018-10-04 DIAGNOSIS — R092 Respiratory arrest: Secondary | ICD-10-CM | POA: Diagnosis present

## 2018-10-04 DIAGNOSIS — J9690 Respiratory failure, unspecified, unspecified whether with hypoxia or hypercapnia: Secondary | ICD-10-CM

## 2018-10-04 DIAGNOSIS — I469 Cardiac arrest, cause unspecified: Secondary | ICD-10-CM | POA: Diagnosis present

## 2018-10-04 DIAGNOSIS — I493 Ventricular premature depolarization: Secondary | ICD-10-CM | POA: Diagnosis present

## 2018-10-04 DIAGNOSIS — E781 Pure hyperglyceridemia: Secondary | ICD-10-CM | POA: Diagnosis not present

## 2018-10-04 DIAGNOSIS — F1210 Cannabis abuse, uncomplicated: Secondary | ICD-10-CM | POA: Diagnosis present

## 2018-10-04 DIAGNOSIS — T41295A Adverse effect of other general anesthetics, initial encounter: Secondary | ICD-10-CM | POA: Diagnosis not present

## 2018-10-04 LAB — URINALYSIS, ROUTINE W REFLEX MICROSCOPIC
Bacteria, UA: NONE SEEN
Bilirubin Urine: NEGATIVE
Glucose, UA: NEGATIVE mg/dL
Ketones, ur: NEGATIVE mg/dL
Leukocytes,Ua: NEGATIVE
Nitrite: NEGATIVE
Protein, ur: NEGATIVE mg/dL
Specific Gravity, Urine: 1.008 (ref 1.005–1.030)
pH: 6 (ref 5.0–8.0)

## 2018-10-04 LAB — COMPREHENSIVE METABOLIC PANEL
ALT: 136 U/L — ABNORMAL HIGH (ref 0–44)
AST: 139 U/L — ABNORMAL HIGH (ref 15–41)
Albumin: 4.1 g/dL (ref 3.5–5.0)
Alkaline Phosphatase: 82 U/L (ref 38–126)
Anion gap: 19 — ABNORMAL HIGH (ref 5–15)
BUN: 10 mg/dL (ref 6–20)
CO2: 19 mmol/L — ABNORMAL LOW (ref 22–32)
Calcium: 9.1 mg/dL (ref 8.9–10.3)
Chloride: 100 mmol/L (ref 98–111)
Creatinine, Ser: 1.32 mg/dL — ABNORMAL HIGH (ref 0.61–1.24)
GFR calc Af Amer: >60 mL/min (ref >60)
GFR calc non Af Amer: >60 mL/min (ref >60)
Glucose, Bld: 321 mg/dL — ABNORMAL HIGH (ref 70–99)
Potassium: 2.4 mmol/L — CL (ref 3.5–5.1)
Sodium: 138 mmol/L (ref 135–145)
Total Bilirubin: 0.4 mg/dL (ref 0.3–1.2)
Total Protein: 7 g/dL (ref 6.5–8.1)

## 2018-10-04 LAB — BLOOD GAS, ARTERIAL
Acid-base deficit: 12.9 mmol/L — ABNORMAL HIGH (ref 0.0–2.0)
Bicarbonate: 13.8 mmol/L — ABNORMAL LOW (ref 20.0–28.0)
FIO2: 100
O2 Saturation: 98.6 %
Patient temperature: 39.1
pCO2 arterial: 43.7 mmHg (ref 32.0–48.0)
pH, Arterial: 7.142 — CL (ref 7.350–7.450)
pO2, Arterial: 266 mmHg — ABNORMAL HIGH (ref 83.0–108.0)

## 2018-10-04 LAB — I-STAT CHEM 8, ED
BUN: 9 mg/dL (ref 6–20)
Calcium, Ion: 1.12 mmol/L — ABNORMAL LOW (ref 1.15–1.40)
Chloride: 106 mmol/L (ref 98–111)
Creatinine, Ser: 1.5 mg/dL — ABNORMAL HIGH (ref 0.61–1.24)
Glucose, Bld: 339 mg/dL — ABNORMAL HIGH (ref 70–99)
HCT: 43 % (ref 39.0–52.0)
Hemoglobin: 14.6 g/dL (ref 13.0–17.0)
Potassium: 3.2 mmol/L — ABNORMAL LOW (ref 3.5–5.1)
Sodium: 139 mmol/L (ref 135–145)
TCO2: 17 mmol/L — ABNORMAL LOW (ref 22–32)

## 2018-10-04 LAB — CBC WITH DIFFERENTIAL/PLATELET
Abs Immature Granulocytes: 0.15 10*3/uL — ABNORMAL HIGH (ref 0.00–0.07)
Basophils Absolute: 0.1 10*3/uL (ref 0.0–0.1)
Basophils Relative: 0 %
Eosinophils Absolute: 0.1 10*3/uL (ref 0.0–0.5)
Eosinophils Relative: 0 %
HCT: 44.6 % (ref 39.0–52.0)
Hemoglobin: 14.9 g/dL (ref 13.0–17.0)
Immature Granulocytes: 1 %
Lymphocytes Relative: 66 %
Lymphs Abs: 7.9 10*3/uL — ABNORMAL HIGH (ref 0.7–4.0)
MCH: 32.3 pg (ref 26.0–34.0)
MCHC: 33.4 g/dL (ref 30.0–36.0)
MCV: 96.7 fL (ref 80.0–100.0)
Monocytes Absolute: 0.6 10*3/uL (ref 0.1–1.0)
Monocytes Relative: 5 %
Neutro Abs: 3.5 10*3/uL (ref 1.7–7.7)
Neutrophils Relative %: 28 %
Platelets: 283 10*3/uL (ref 150–400)
RBC: 4.61 MIL/uL (ref 4.22–5.81)
RDW: 13.5 % (ref 11.5–15.5)
WBC: 12.2 10*3/uL — ABNORMAL HIGH (ref 4.0–10.5)
nRBC: 0 % (ref 0.0–0.2)

## 2018-10-04 LAB — PROTIME-INR
INR: 1 (ref 0.8–1.2)
Prothrombin Time: 13.2 seconds (ref 11.4–15.2)

## 2018-10-04 LAB — RAPID URINE DRUG SCREEN, HOSP PERFORMED
Amphetamines: NOT DETECTED
Barbiturates: NOT DETECTED
Benzodiazepines: POSITIVE — AB
Cocaine: NOT DETECTED
Opiates: NOT DETECTED
Tetrahydrocannabinol: POSITIVE — AB

## 2018-10-04 LAB — ETHANOL: Alcohol, Ethyl (B): 216 mg/dL — ABNORMAL HIGH (ref <10)

## 2018-10-04 LAB — APTT: aPTT: 29 seconds (ref 24–36)

## 2018-10-04 LAB — LACTIC ACID, PLASMA
Lactic Acid, Venous: 8.2 mmol/L (ref 0.5–1.9)
Lactic Acid, Venous: 5.8 mmol/L (ref 0.5–1.9)

## 2018-10-04 LAB — SARS CORONAVIRUS 2 BY RT PCR (HOSPITAL ORDER, PERFORMED IN ~~LOC~~ HOSPITAL LAB): SARS Coronavirus 2: NEGATIVE

## 2018-10-04 LAB — TROPONIN I (HIGH SENSITIVITY): Troponin I (High Sensitivity): 5 ng/L (ref <18)

## 2018-10-04 MED ORDER — PROPOFOL 1000 MG/100ML IV EMUL
5.0000 ug/kg/min | INTRAVENOUS | Status: DC
  Administered 2018-10-04: 23:00:00 5 ug/kg/min via INTRAVENOUS

## 2018-10-04 MED ORDER — SODIUM CHLORIDE 0.9 % IV SOLN
INTRAVENOUS | Status: AC | PRN
  Administered 2018-10-04: 1000 mL via INTRAVENOUS

## 2018-10-04 MED ORDER — NALOXONE HCL 2 MG/2ML IJ SOSY
PREFILLED_SYRINGE | INTRAMUSCULAR | Status: AC
  Filled 2018-10-04: qty 2

## 2018-10-04 MED ORDER — NALOXONE HCL 2 MG/2ML IJ SOSY
PREFILLED_SYRINGE | INTRAMUSCULAR | Status: AC | PRN
  Administered 2018-10-04: 2 mg via INTRAVENOUS

## 2018-10-04 MED ORDER — VANCOMYCIN HCL IN DEXTROSE 1-5 GM/200ML-% IV SOLN
1000.0000 mg | Freq: Once | INTRAVENOUS | Status: AC
  Administered 2018-10-04: 1000 mg via INTRAVENOUS
  Filled 2018-10-04: qty 200

## 2018-10-04 MED ORDER — SUCCINYLCHOLINE CHLORIDE 20 MG/ML IJ SOLN
100.0000 mg | Freq: Once | INTRAMUSCULAR | Status: AC
  Administered 2018-10-04: 100 mg via INTRAVENOUS
  Filled 2018-10-04: qty 1

## 2018-10-04 MED ORDER — POTASSIUM CHLORIDE 10 MEQ/100ML IV SOLN
10.0000 meq | Freq: Once | INTRAVENOUS | Status: AC
  Administered 2018-10-04: 10 meq via INTRAVENOUS
  Filled 2018-10-04: qty 100

## 2018-10-04 MED ORDER — PROPOFOL 1000 MG/100ML IV EMUL
INTRAVENOUS | Status: AC
  Administered 2018-10-04: 5 ug/kg/min via INTRAVENOUS
  Filled 2018-10-04: qty 100

## 2018-10-04 MED ORDER — PIPERACILLIN-TAZOBACTAM 3.375 G IVPB 30 MIN
3.3750 g | Freq: Once | INTRAVENOUS | Status: AC
  Administered 2018-10-04: 3.375 g via INTRAVENOUS
  Filled 2018-10-04: qty 50

## 2018-10-04 NOTE — ED Notes (Signed)
CRITICAL VALUE ALERT  Critical Value:  ABG (Ph) 7.142  Date & Time Notied:  10/06/2018 2201  Provider Notified: Dr. Roderic Palau  Orders Received/Actions taken: See chart

## 2018-10-04 NOTE — ED Notes (Signed)
Ice packs placed on patient for cooling measures.

## 2018-10-04 NOTE — ED Notes (Signed)
Date and time results received: 11-02-18 9:56 PM  (use smartphrase ".now" to insert current time)  Test: potassium Critical Value: 2.4  Name of Provider Notified: [email or web address]  Orders Received? Or Actions Taken?:
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
## 2018-10-04 NOTE — ED Notes (Addendum)
Date and time results received: 10/19/18 10:16 PM  (use smartphrase ".now" to insert current time)  Test: lactic Critical Value: 8.2  Name of Provider Notified: [email or web address]  Orders Received? Or Actions Taken?:

## 2018-10-04 NOTE — ED Notes (Signed)
Unable to complete triage due to pt being intubated.

## 2018-10-04 NOTE — ED Notes (Signed)
Respiratory at bedside.
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
## 2018-10-05 ENCOUNTER — Inpatient Hospital Stay (HOSPITAL_COMMUNITY): Payer: Medicaid Other

## 2018-10-05 DIAGNOSIS — J80 Acute respiratory distress syndrome: Secondary | ICD-10-CM

## 2018-10-05 DIAGNOSIS — R092 Respiratory arrest: Secondary | ICD-10-CM

## 2018-10-05 DIAGNOSIS — I469 Cardiac arrest, cause unspecified: Secondary | ICD-10-CM | POA: Diagnosis present

## 2018-10-05 LAB — BASIC METABOLIC PANEL
Anion gap: 12 (ref 5–15)
Anion gap: 12 (ref 5–15)
Anion gap: 15 (ref 5–15)
Anion gap: 13 (ref 5–15)
Anion gap: 13 (ref 5–15)
BUN: 12 mg/dL (ref 6–20)
BUN: 12 mg/dL (ref 6–20)
BUN: 13 mg/dL (ref 6–20)
BUN: 17 mg/dL (ref 6–20)
BUN: 19 mg/dL (ref 6–20)
CO2: 20 mmol/L — ABNORMAL LOW (ref 22–32)
CO2: 19 mmol/L — ABNORMAL LOW (ref 22–32)
CO2: 17 mmol/L — ABNORMAL LOW (ref 22–32)
CO2: 20 mmol/L — ABNORMAL LOW (ref 22–32)
CO2: 20 mmol/L — ABNORMAL LOW (ref 22–32)
Calcium: 8.9 mg/dL (ref 8.9–10.3)
Calcium: 8.8 mg/dL — ABNORMAL LOW (ref 8.9–10.3)
Calcium: 9 mg/dL (ref 8.9–10.3)
Calcium: 8.8 mg/dL — ABNORMAL LOW (ref 8.9–10.3)
Calcium: 8.5 mg/dL — ABNORMAL LOW (ref 8.9–10.3)
Chloride: 111 mmol/L (ref 98–111)
Chloride: 112 mmol/L — ABNORMAL HIGH (ref 98–111)
Chloride: 109 mmol/L (ref 98–111)
Chloride: 111 mmol/L (ref 98–111)
Chloride: 110 mmol/L (ref 98–111)
Creatinine, Ser: 1.59 mg/dL — ABNORMAL HIGH (ref 0.61–1.24)
Creatinine, Ser: 1.3 mg/dL — ABNORMAL HIGH (ref 0.61–1.24)
Creatinine, Ser: 1.37 mg/dL — ABNORMAL HIGH (ref 0.61–1.24)
Creatinine, Ser: 1.56 mg/dL — ABNORMAL HIGH (ref 0.61–1.24)
Creatinine, Ser: 1.34 mg/dL — ABNORMAL HIGH (ref 0.61–1.24)
GFR calc Af Amer: >60 mL/min (ref >60)
GFR calc Af Amer: >60 mL/min (ref >60)
GFR calc Af Amer: >60 mL/min (ref >60)
GFR calc Af Amer: >60 mL/min (ref >60)
GFR calc Af Amer: >60 mL/min (ref >60)
GFR calc non Af Amer: 58 mL/min — ABNORMAL LOW (ref >60)
GFR calc non Af Amer: >60 mL/min (ref >60)
GFR calc non Af Amer: >60 mL/min (ref >60)
GFR calc non Af Amer: 59 mL/min — ABNORMAL LOW (ref >60)
GFR calc non Af Amer: >60 mL/min (ref >60)
Glucose, Bld: 92 mg/dL (ref 70–99)
Glucose, Bld: 122 mg/dL — ABNORMAL HIGH (ref 70–99)
Glucose, Bld: 153 mg/dL — ABNORMAL HIGH (ref 70–99)
Glucose, Bld: 120 mg/dL — ABNORMAL HIGH (ref 70–99)
Glucose, Bld: 116 mg/dL — ABNORMAL HIGH (ref 70–99)
Potassium: 2.8 mmol/L — ABNORMAL LOW (ref 3.5–5.1)
Potassium: 2.5 mmol/L — CL (ref 3.5–5.1)
Potassium: 2.9 mmol/L — ABNORMAL LOW (ref 3.5–5.1)
Potassium: 4.3 mmol/L (ref 3.5–5.1)
Potassium: 5.4 mmol/L — ABNORMAL HIGH (ref 3.5–5.1)
Sodium: 143 mmol/L (ref 135–145)
Sodium: 143 mmol/L (ref 135–145)
Sodium: 141 mmol/L (ref 135–145)
Sodium: 144 mmol/L (ref 135–145)
Sodium: 143 mmol/L (ref 135–145)

## 2018-10-05 LAB — POCT I-STAT 7, (LYTES, BLD GAS, ICA,H+H)
Acid-base deficit: 14 mmol/L — ABNORMAL HIGH (ref 0.0–2.0)
Acid-base deficit: 7 mmol/L — ABNORMAL HIGH (ref 0.0–2.0)
Acid-base deficit: 9 mmol/L — ABNORMAL HIGH (ref 0.0–2.0)
Bicarbonate: 16 mmol/L — ABNORMAL LOW (ref 20.0–28.0)
Bicarbonate: 20.5 mmol/L (ref 20.0–28.0)
Bicarbonate: 19.9 mmol/L — ABNORMAL LOW (ref 20.0–28.0)
Calcium, Ion: 1.35 mmol/L (ref 1.15–1.40)
Calcium, Ion: 1.28 mmol/L (ref 1.15–1.40)
Calcium, Ion: 1.25 mmol/L (ref 1.15–1.40)
HCT: 52 % (ref 39.0–52.0)
HCT: 51 % (ref 39.0–52.0)
HCT: 50 % (ref 39.0–52.0)
Hemoglobin: 17.7 g/dL — ABNORMAL HIGH (ref 13.0–17.0)
Hemoglobin: 17.3 g/dL — ABNORMAL HIGH (ref 13.0–17.0)
Hemoglobin: 17 g/dL (ref 13.0–17.0)
O2 Saturation: 89 %
O2 Saturation: 95 %
O2 Saturation: 97 %
Patient temperature: 39.7
Patient temperature: 37
Patient temperature: 32.9
Potassium: 2.9 mmol/L — ABNORMAL LOW (ref 3.5–5.1)
Potassium: 2.4 mmol/L — CL (ref 3.5–5.1)
Potassium: 3.7 mmol/L (ref 3.5–5.1)
Sodium: 147 mmol/L — ABNORMAL HIGH (ref 135–145)
Sodium: 148 mmol/L — ABNORMAL HIGH (ref 135–145)
Sodium: 146 mmol/L — ABNORMAL HIGH (ref 135–145)
TCO2: 18 mmol/L — ABNORMAL LOW (ref 22–32)
TCO2: 22 mmol/L (ref 22–32)
TCO2: 21 mmol/L — ABNORMAL LOW (ref 22–32)
pCO2 arterial: 60.3 mmHg — ABNORMAL HIGH (ref 32.0–48.0)
pCO2 arterial: 47.7 mmHg (ref 32.0–48.0)
pCO2 arterial: 42.5 mmHg (ref 32.0–48.0)
pH, Arterial: 7.05 — CL (ref 7.350–7.450)
pH, Arterial: 7.242 — ABNORMAL LOW (ref 7.350–7.450)
pH, Arterial: 7.256 — ABNORMAL LOW (ref 7.350–7.450)
pO2, Arterial: 93 mmHg (ref 83.0–108.0)
pO2, Arterial: 86 mmHg (ref 83.0–108.0)
pO2, Arterial: 90 mmHg (ref 83.0–108.0)

## 2018-10-05 LAB — PROTIME-INR
INR: 0.9 (ref 0.8–1.2)
INR: 0.9 (ref 0.8–1.2)
INR: 0.9 (ref 0.8–1.2)
Prothrombin Time: 11.7 seconds (ref 11.4–15.2)
Prothrombin Time: 12.1 seconds (ref 11.4–15.2)
Prothrombin Time: 12.1 seconds (ref 11.4–15.2)

## 2018-10-05 LAB — OSMOLALITY, URINE: Osmolality, Ur: 323 mOsm/kg (ref 300–900)

## 2018-10-05 LAB — GLUCOSE, CAPILLARY
Glucose-Capillary: 87 mg/dL (ref 70–99)
Glucose-Capillary: 112 mg/dL — ABNORMAL HIGH (ref 70–99)
Glucose-Capillary: 128 mg/dL — ABNORMAL HIGH (ref 70–99)
Glucose-Capillary: 102 mg/dL — ABNORMAL HIGH (ref 70–99)
Glucose-Capillary: 137 mg/dL — ABNORMAL HIGH (ref 70–99)
Glucose-Capillary: 151 mg/dL — ABNORMAL HIGH (ref 70–99)
Glucose-Capillary: 158 mg/dL — ABNORMAL HIGH (ref 70–99)
Glucose-Capillary: 149 mg/dL — ABNORMAL HIGH (ref 70–99)
Glucose-Capillary: 105 mg/dL — ABNORMAL HIGH (ref 70–99)
Glucose-Capillary: 75 mg/dL (ref 70–99)
Glucose-Capillary: 125 mg/dL — ABNORMAL HIGH (ref 70–99)
Glucose-Capillary: 120 mg/dL — ABNORMAL HIGH (ref 70–99)
Glucose-Capillary: 121 mg/dL — ABNORMAL HIGH (ref 70–99)
Glucose-Capillary: 120 mg/dL — ABNORMAL HIGH (ref 70–99)
Glucose-Capillary: 116 mg/dL — ABNORMAL HIGH (ref 70–99)
Glucose-Capillary: 116 mg/dL — ABNORMAL HIGH (ref 70–99)
Glucose-Capillary: 127 mg/dL — ABNORMAL HIGH (ref 70–99)

## 2018-10-05 LAB — CBC
HCT: 50.7 % (ref 39.0–52.0)
Hemoglobin: 17.4 g/dL — ABNORMAL HIGH (ref 13.0–17.0)
MCH: 30.9 pg (ref 26.0–34.0)
MCHC: 34.3 g/dL (ref 30.0–36.0)
MCV: 90.1 fL (ref 80.0–100.0)
Platelets: 295 10*3/uL (ref 150–400)
RBC: 5.63 MIL/uL (ref 4.22–5.81)
RDW: 13.3 % (ref 11.5–15.5)
WBC: 12.9 10*3/uL — ABNORMAL HIGH (ref 4.0–10.5)
nRBC: 0 % (ref 0.0–0.2)

## 2018-10-05 LAB — POCT I-STAT, CHEM 8
BUN: 14 mg/dL (ref 6–20)
BUN: 16 mg/dL (ref 6–20)
BUN: 16 mg/dL (ref 6–20)
Calcium, Ion: 1.3 mmol/L (ref 1.15–1.40)
Calcium, Ion: 1.31 mmol/L (ref 1.15–1.40)
Calcium, Ion: 1.3 mmol/L (ref 1.15–1.40)
Chloride: 112 mmol/L — ABNORMAL HIGH (ref 98–111)
Chloride: 111 mmol/L (ref 98–111)
Chloride: 113 mmol/L — ABNORMAL HIGH (ref 98–111)
Creatinine, Ser: 1.4 mg/dL — ABNORMAL HIGH (ref 0.61–1.24)
Creatinine, Ser: 1.2 mg/dL (ref 0.61–1.24)
Creatinine, Ser: 1.2 mg/dL (ref 0.61–1.24)
Glucose, Bld: 93 mg/dL (ref 70–99)
Glucose, Bld: 125 mg/dL — ABNORMAL HIGH (ref 70–99)
Glucose, Bld: 148 mg/dL — ABNORMAL HIGH (ref 70–99)
HCT: 51 % (ref 39.0–52.0)
HCT: 53 % — ABNORMAL HIGH (ref 39.0–52.0)
HCT: 53 % — ABNORMAL HIGH (ref 39.0–52.0)
Hemoglobin: 17.3 g/dL — ABNORMAL HIGH (ref 13.0–17.0)
Hemoglobin: 18 g/dL — ABNORMAL HIGH (ref 13.0–17.0)
Hemoglobin: 18 g/dL — ABNORMAL HIGH (ref 13.0–17.0)
Potassium: 2.6 mmol/L — CL (ref 3.5–5.1)
Potassium: 2.6 mmol/L — CL (ref 3.5–5.1)
Potassium: 3.1 mmol/L — ABNORMAL LOW (ref 3.5–5.1)
Sodium: 148 mmol/L — ABNORMAL HIGH (ref 135–145)
Sodium: 147 mmol/L — ABNORMAL HIGH (ref 135–145)
Sodium: 146 mmol/L — ABNORMAL HIGH (ref 135–145)
TCO2: 21 mmol/L — ABNORMAL LOW (ref 22–32)
TCO2: 21 mmol/L — ABNORMAL LOW (ref 22–32)
TCO2: 20 mmol/L — ABNORMAL LOW (ref 22–32)

## 2018-10-05 LAB — TROPONIN I (HIGH SENSITIVITY)
Troponin I (High Sensitivity): 89 ng/L — ABNORMAL HIGH (ref <18)
Troponin I (High Sensitivity): 699 ng/L (ref <18)
Troponin I (High Sensitivity): 778 ng/L (ref <18)

## 2018-10-05 LAB — ETHANOL: Alcohol, Ethyl (B): 10 mg/dL — ABNORMAL HIGH (ref <10)

## 2018-10-05 LAB — MAGNESIUM
Magnesium: 1.9 mg/dL (ref 1.7–2.4)
Magnesium: 2.3 mg/dL (ref 1.7–2.4)

## 2018-10-05 LAB — CREATININE, URINE, RANDOM: Creatinine, Urine: 46.59 mg/dL

## 2018-10-05 LAB — BLOOD GAS, ARTERIAL
Acid-base deficit: 12 mmol/L — ABNORMAL HIGH (ref 0.0–2.0)
Bicarbonate: 14.4 mmol/L — ABNORMAL LOW (ref 20.0–28.0)
Drawn by: 41977
FIO2: 100
O2 Saturation: 85.6 %
PEEP: 14 cmH2O
Patient temperature: 38.2
pCO2 arterial: 39.7 mmHg (ref 32.0–48.0)
pH, Arterial: 7.19 — CL (ref 7.350–7.450)
pO2, Arterial: 63.7 mmHg — ABNORMAL LOW (ref 83.0–108.0)

## 2018-10-05 LAB — PHOSPHORUS
Phosphorus: 1.5 mg/dL — ABNORMAL LOW (ref 2.5–4.6)
Phosphorus: 3.3 mg/dL (ref 2.5–4.6)

## 2018-10-05 LAB — HEPATIC FUNCTION PANEL
ALT: 251 U/L — ABNORMAL HIGH (ref 0–44)
AST: 380 U/L — ABNORMAL HIGH (ref 15–41)
Albumin: 3.9 g/dL (ref 3.5–5.0)
Alkaline Phosphatase: 97 U/L (ref 38–126)
Bilirubin, Direct: 0.4 mg/dL — ABNORMAL HIGH (ref 0.0–0.2)
Indirect Bilirubin: 0.7 mg/dL (ref 0.3–0.9)
Total Bilirubin: 1.1 mg/dL (ref 0.3–1.2)
Total Protein: 6.8 g/dL (ref 6.5–8.1)

## 2018-10-05 LAB — APTT
aPTT: 25 seconds (ref 24–36)
aPTT: 25 seconds (ref 24–36)
aPTT: 29 seconds (ref 24–36)

## 2018-10-05 LAB — ACETAMINOPHEN LEVEL: Acetaminophen (Tylenol), Serum: <10 ug/mL — ABNORMAL LOW (ref 10–30)

## 2018-10-05 LAB — LIPASE, BLOOD: Lipase: 15 U/L (ref 11–51)

## 2018-10-05 LAB — PROCALCITONIN: Procalcitonin: 41.55 ng/mL

## 2018-10-05 LAB — ECHOCARDIOGRAM COMPLETE
Height: 70 in
Weight: 3044.11 oz

## 2018-10-05 LAB — CK: Total CK: 236 U/L (ref 49–397)

## 2018-10-05 LAB — OSMOLALITY: Osmolality: 306 mOsm/kg — ABNORMAL HIGH (ref 275–295)

## 2018-10-05 LAB — LACTIC ACID, PLASMA: Lactic Acid, Venous: 3.3 mmol/L (ref 0.5–1.9)

## 2018-10-05 LAB — SODIUM, URINE, RANDOM: Sodium, Ur: 132 mmol/L

## 2018-10-05 LAB — SALICYLATE LEVEL: Salicylate Lvl: <7 mg/dL (ref 2.8–30.0)

## 2018-10-05 LAB — MRSA PCR SCREENING: MRSA by PCR: NEGATIVE

## 2018-10-05 MED ORDER — PANTOPRAZOLE SODIUM 40 MG IV SOLR
40.0000 mg | Freq: Every day | INTRAVENOUS | Status: DC
  Administered 2018-10-05 – 2018-10-08 (×4): 40 mg via INTRAVENOUS
  Filled 2018-10-05 (×4): qty 40

## 2018-10-05 MED ORDER — ASPIRIN 300 MG RE SUPP
300.0000 mg | Freq: Once | RECTAL | Status: AC
  Administered 2018-10-05: 300 mg via RECTAL
  Filled 2018-10-05: qty 1

## 2018-10-05 MED ORDER — ORAL CARE MOUTH RINSE
15.0000 mL | OROMUCOSAL | Status: DC
  Administered 2018-10-05 – 2018-10-14 (×89): 15 mL via OROMUCOSAL

## 2018-10-05 MED ORDER — VECURONIUM BROMIDE 10 MG IV SOLR
INTRAVENOUS | Status: AC
  Administered 2018-10-05: 10 mg
  Filled 2018-10-05: qty 10

## 2018-10-05 MED ORDER — MIDAZOLAM BOLUS VIA INFUSION
2.0000 mg | INTRAVENOUS | Status: DC | PRN
  Filled 2018-10-05: qty 2

## 2018-10-05 MED ORDER — PROPOFOL 1000 MG/100ML IV EMUL
INTRAVENOUS | Status: AC
  Filled 2018-10-05: qty 100

## 2018-10-05 MED ORDER — LACTATED RINGERS IV BOLUS
1000.0000 mL | Freq: Once | INTRAVENOUS | Status: AC
  Administered 2018-10-05: 03:00:00 200 mL via INTRAVENOUS

## 2018-10-05 MED ORDER — FENTANYL CITRATE (PF) 100 MCG/2ML IJ SOLN
100.0000 ug | Freq: Once | INTRAMUSCULAR | Status: AC
  Administered 2018-10-05: 100 ug via INTRAVENOUS

## 2018-10-05 MED ORDER — MIDAZOLAM HCL 2 MG/2ML IJ SOLN
2.0000 mg | Freq: Once | INTRAMUSCULAR | Status: AC
  Administered 2018-10-05: 2 mg via INTRAVENOUS
  Filled 2018-10-05: qty 2

## 2018-10-05 MED ORDER — MIDAZOLAM 50MG/50ML (1MG/ML) PREMIX INFUSION
2.0000 mg/h | INTRAVENOUS | Status: DC
  Administered 2018-10-05: 01:00:00 2 mg/h via INTRAVENOUS
  Filled 2018-10-05: qty 50

## 2018-10-05 MED ORDER — SODIUM CHLORIDE 0.9 % IV SOLN
1.0000 ug/kg/min | INTRAVENOUS | Status: DC
  Administered 2018-10-05 – 2018-10-06 (×2): 1 ug/kg/min via INTRAVENOUS
  Filled 2018-10-05 (×2): qty 20

## 2018-10-05 MED ORDER — CHLORHEXIDINE GLUCONATE CLOTH 2 % EX PADS
6.0000 | MEDICATED_PAD | Freq: Every day | CUTANEOUS | Status: DC
  Administered 2018-10-06 – 2018-10-12 (×7): 6 via TOPICAL

## 2018-10-05 MED ORDER — MIDAZOLAM 50MG/50ML (1MG/ML) PREMIX INFUSION
INTRAVENOUS | Status: AC
  Filled 2018-10-05: qty 50

## 2018-10-05 MED ORDER — SODIUM CHLORIDE 0.9 % IV SOLN: INTRAVENOUS | Status: DC

## 2018-10-05 MED ORDER — CISATRACURIUM BOLUS VIA INFUSION
0.1000 mg/kg | Freq: Once | INTRAVENOUS | Status: DC
  Filled 2018-10-05: qty 10

## 2018-10-05 MED ORDER — MAGNESIUM SULFATE 2 GM/50ML IV SOLN
2.0000 g | Freq: Once | INTRAVENOUS | Status: AC
  Administered 2018-10-05: 2 g via INTRAVENOUS
  Filled 2018-10-05: qty 50

## 2018-10-05 MED ORDER — VECURONIUM BROMIDE 10 MG IV SOLR: 50.0000 mg | Freq: Once | INTRAVENOUS | Status: DC

## 2018-10-05 MED ORDER — ROCURONIUM BROMIDE 50 MG/5ML IV SOLN
50.0000 mg | Freq: Once | INTRAVENOUS | Status: AC
  Administered 2018-10-05: 50 mg via INTRAVENOUS
  Filled 2018-10-05: qty 5

## 2018-10-05 MED ORDER — POTASSIUM CHLORIDE 10 MEQ/100ML IV SOLN
10.0000 meq | INTRAVENOUS | Status: AC
  Administered 2018-10-05 (×4): 10 meq via INTRAVENOUS
  Filled 2018-10-05 (×4): qty 100

## 2018-10-05 MED ORDER — SODIUM CHLORIDE 0.9 % IV SOLN
INTRAVENOUS | Status: DC
  Administered 2018-10-05 – 2018-10-06 (×3): via INTRAVENOUS

## 2018-10-05 MED ORDER — CISATRACURIUM BOLUS VIA INFUSION
0.0500 mg/kg | INTRAVENOUS | Status: DC | PRN
  Filled 2018-10-05: qty 5

## 2018-10-05 MED ORDER — POTASSIUM CHLORIDE 20 MEQ/15ML (10%) PO SOLN
40.0000 meq | Freq: Once | ORAL | Status: AC
  Administered 2018-10-05: 40 meq
  Filled 2018-10-05: qty 30

## 2018-10-05 MED ORDER — CHLORHEXIDINE GLUCONATE 0.12% ORAL RINSE (MEDLINE KIT): 15.0000 mL | Freq: Two times a day (BID) | OROMUCOSAL | Status: DC

## 2018-10-05 MED ORDER — PROPOFOL 1000 MG/100ML IV EMUL: 5.0000 ug/kg/min | INTRAVENOUS | Status: DC

## 2018-10-05 MED ORDER — SODIUM CHLORIDE 0.9% FLUSH: 10.0000 mL | INTRAVENOUS | Status: DC | PRN

## 2018-10-05 MED ORDER — PIPERACILLIN-TAZOBACTAM 3.375 G IVPB
3.3750 g | Freq: Three times a day (TID) | INTRAVENOUS | Status: AC
  Administered 2018-10-05 – 2018-10-12 (×21): 3.375 g via INTRAVENOUS
  Filled 2018-10-05 (×21): qty 50

## 2018-10-05 MED ORDER — CHLORHEXIDINE GLUCONATE CLOTH 2 % EX PADS
6.0000 | MEDICATED_PAD | Freq: Every day | CUTANEOUS | Status: DC
  Administered 2018-10-05: 6 via TOPICAL

## 2018-10-05 MED ORDER — FUROSEMIDE 10 MG/ML IJ SOLN
40.0000 mg | Freq: Once | INTRAMUSCULAR | Status: AC
  Administered 2018-10-05: 40 mg via INTRAVENOUS
  Filled 2018-10-05: qty 4

## 2018-10-05 MED ORDER — FENTANYL BOLUS VIA INFUSION
50.0000 ug | INTRAVENOUS | Status: DC | PRN
  Administered 2018-10-05 – 2018-10-06 (×2): 50 ug via INTRAVENOUS
  Filled 2018-10-05: qty 50

## 2018-10-05 MED ORDER — FENTANYL 2500MCG IN NS 250ML (10MCG/ML) PREMIX INFUSION
100.0000 ug/h | INTRAVENOUS | Status: DC
  Administered 2018-10-05: 100 ug/h via INTRAVENOUS
  Administered 2018-10-05: 250 ug/h via INTRAVENOUS
  Administered 2018-10-06: 21:00:00 300 ug/h via INTRAVENOUS
  Administered 2018-10-06: 12:00:00 275 ug/h via INTRAVENOUS
  Administered 2018-10-06: 03:00:00 250 ug/h via INTRAVENOUS
  Filled 2018-10-05 (×5): qty 250

## 2018-10-05 MED ORDER — THIAMINE HCL 100 MG/ML IJ SOLN
100.0000 mg | Freq: Every day | INTRAMUSCULAR | Status: DC
  Administered 2018-10-05 – 2018-10-13 (×9): 100 mg via INTRAVENOUS
  Filled 2018-10-05 (×9): qty 2

## 2018-10-05 MED ORDER — FOLIC ACID 5 MG/ML IJ SOLN
1.0000 mg | Freq: Every day | INTRAMUSCULAR | Status: DC
  Administered 2018-10-05 – 2018-10-13 (×9): 1 mg via INTRAVENOUS
  Filled 2018-10-05 (×11): qty 0.2

## 2018-10-05 MED ORDER — SODIUM BICARBONATE 8.4 % IV SOLN
100.0000 meq | Freq: Once | INTRAVENOUS | Status: AC
  Administered 2018-10-05: 100 meq via INTRAVENOUS

## 2018-10-05 MED ORDER — HEPARIN SODIUM (PORCINE) 5000 UNIT/ML IJ SOLN
5000.0000 [IU] | Freq: Three times a day (TID) | INTRAMUSCULAR | Status: DC
  Administered 2018-10-05 – 2018-10-13 (×27): 5000 [IU] via SUBCUTANEOUS
  Filled 2018-10-05 (×27): qty 1

## 2018-10-05 MED ORDER — ORAL CARE MOUTH RINSE: 15.0000 mL | OROMUCOSAL | Status: DC

## 2018-10-05 MED ORDER — ARTIFICIAL TEARS OPHTHALMIC OINT
1.0000 "application " | TOPICAL_OINTMENT | Freq: Three times a day (TID) | OPHTHALMIC | Status: DC
  Administered 2018-10-05 – 2018-10-06 (×5): 1 via OPHTHALMIC
  Filled 2018-10-05 (×2): qty 3.5

## 2018-10-05 MED ORDER — NOREPINEPHRINE 4 MG/250ML-% IV SOLN: 0.0000 ug/min | INTRAVENOUS | Status: DC

## 2018-10-05 MED ORDER — SODIUM CHLORIDE 0.9% FLUSH
10.0000 mL | Freq: Two times a day (BID) | INTRAVENOUS | Status: DC
  Administered 2018-10-05 – 2018-10-12 (×12): 10 mL
  Administered 2018-10-12: 30 mL
  Administered 2018-10-13: 10 mL

## 2018-10-05 MED ORDER — CHLORHEXIDINE GLUCONATE 0.12% ORAL RINSE (MEDLINE KIT)
15.0000 mL | Freq: Two times a day (BID) | OROMUCOSAL | Status: DC
  Administered 2018-10-05 – 2018-10-14 (×19): 15 mL via OROMUCOSAL

## 2018-10-05 MED ORDER — POTASSIUM CHLORIDE 10 MEQ/50ML IV SOLN
10.0000 meq | INTRAVENOUS | Status: AC
  Administered 2018-10-05 (×4): 10 meq via INTRAVENOUS
  Filled 2018-10-05 (×4): qty 50

## 2018-10-05 MED ORDER — LACTATED RINGERS IV SOLN: INTRAVENOUS | Status: DC

## 2018-10-05 MED ORDER — SODIUM CHLORIDE 0.9 % IV SOLN
1.0000 ug/kg/min | INTRAVENOUS | Status: DC
  Administered 2018-10-05: 1 ug/kg/min via INTRAVENOUS
  Filled 2018-10-05: qty 20

## 2018-10-05 MED ORDER — SODIUM CHLORIDE 0.9 % IV SOLN
1.0000 ug/kg/min | INTRAVENOUS | Status: DC
  Filled 2018-10-05: qty 20

## 2018-10-05 MED ORDER — SODIUM BICARBONATE 8.4 % IV SOLN
INTRAVENOUS | Status: AC
  Filled 2018-10-05: qty 100

## 2018-10-05 MED ORDER — MIDAZOLAM HCL 2 MG/2ML IJ SOLN
2.0000 mg | Freq: Once | INTRAMUSCULAR | Status: AC
  Administered 2018-10-05: 2 mg via INTRAVENOUS

## 2018-10-05 MED ORDER — MIDAZOLAM 50MG/50ML (1MG/ML) PREMIX INFUSION
2.0000 mg/h | INTRAVENOUS | Status: DC
  Administered 2018-10-05: 3 mg/h via INTRAVENOUS
  Administered 2018-10-05 – 2018-10-06 (×4): 5 mg/h via INTRAVENOUS
  Filled 2018-10-05 (×4): qty 50

## 2018-10-05 NOTE — Progress Notes (Signed)
vLTM EEG started following results of spot EEG. Notified Neuro

## 2018-10-05 NOTE — Progress Notes (Signed)
Initial Nutrition Assessment  DOCUMENTATION CODES:   Not applicable  INTERVENTION:   - RD will follow for ability to start trickle feeds. Recommend Vital AF 1.2 @ 20 ml/hr (480 ml/day).  Goal tube feeding recommendations: - Vital AF 1.2 @ 65 ml/hr (1560 ml/day) via OG tube - Pro-stat 30 ml daily  Recommended tube feeding regimen provides 1972 kcal, 132 grams of protein, and 1265 ml of H2O.   NUTRITION DIAGNOSIS:   Inadequate oral intake related to inability to eat as evidenced by NPO status.  GOAL:   Patient will meet greater than or equal to 90% of their needs  MONITOR:   Vent status, Labs, Weight trends, I & O's  REASON FOR ASSESSMENT:   Ventilator    ASSESSMENT:   28 year old male who presented to the ED on 9/09 after a witnessed episode of choking. Pt with cardiac and respiratory arrest, CPR initiated and ROSC obtained. Pt required intubation. PMH of tobacco/EtOH/substance abuse. TTM 33 degrees initiated.   Discussed pt with RN and during ICU rounds.  OG tube in place, currently to low intermittent suction.  Patient is currently intubated on ventilator support MV: 12.9 L/min Temp (24hrs), Avg:99 F (37.2 C), Min:92.7 F (33.7 C), Max:103.5 F (39.7 C) BP (a-line): 133/89 MAP (a-line): 105  Drips: Fentanyl: 12.5 ml/hr Versed: 1 ml/hr Nimbex: 5.2 ml/hr  Medications reviewed and include: folic acid, heparin, thiamine, Protonix, IV KCl 10 mEq x 4  Labs reviewed: potassium 2.9, creatinine 1.37, elevated LFTs CBG's: 87-158  UOP: 1190 ml x 12 hours  NUTRITION - FOCUSED PHYSICAL EXAM:    Most Recent Value  Orbital Region  No depletion  Upper Arm Region  No depletion  Thoracic and Lumbar Region  No depletion  Buccal Region  No depletion  Temple Region  No depletion  Clavicle Bone Region  No depletion  Clavicle and Acromion Bone Region  No depletion  Scapular Bone Region  No depletion  Dorsal Hand  No depletion  Patellar Region  No depletion   Anterior Thigh Region  No depletion  Posterior Calf Region  No depletion  Edema (RD Assessment)  None  Hair  Reviewed  Eyes  Reviewed  Mouth  Reviewed  Skin  Reviewed  Nails  Reviewed       Diet Order:   Diet Order    None      EDUCATION NEEDS:   No education needs have been identified at this time  Skin:  Skin Assessment: Reviewed RN Assessment  Last BM:  no documented BM  Height:   Ht Readings from Last 1 Encounters:  10/05/2018 5\' 10"  (1.778 m)    Weight:   Wt Readings from Last 1 Encounters:  10/05/18 86.3 kg    Ideal Body Weight:  75.5 kg  BMI:  Body mass index is 27.3 kg/m.  Estimated Nutritional Needs:   Kcal:  2129  Protein:  120-140 grams  Fluid:  >/= 2.0 L    Gaynell Face, MS, RD, LDN Inpatient Clinical Dietitian Pager: [phone] Weekend/After Hours: [phone]

## 2018-10-05 NOTE — Progress Notes (Addendum)
NAME:  Eric Archer, MRN:  [id number], DOB:  11/03/1990, LOS: 1 ADMISSION DATE:  10/18/2018, CONSULTATION DATE:  10/13/2018 REFERRING MD:  Roderic Palau, CHIEF COMPLAINT:  Cardiac arrest  Brief History   28 year old male with history of substance abuse presented to witnessed cardiac arrest--no bystander CPR performed.  EMS arrived and 5 to 8 minutes where patient was found to be in asystole and CPR was initiated. ROSC achieved 15 minutes later for a total time until resuscitation of 20 to 23 minutes.  History of present illness   This is a 28 year old male with past medical history significant for substance abuse presents the ED on 10/13/2018 via EMS status post cardiac arrest.  Cardiac arrest was witnessed by patient's girlfriend.  No bystander CPR was initiated at that time.  Is noted that took EMS about 5 to 8 minutes to arrive at the scene followed by about 15 minutes of CPR after which time ROSC was obtained.  During that time 1 mg Narcan and 1 mg epi.  Patient did not resume spontaneous breathing. At time of presentation is unclear which substances or abuse if any.  Father does note history of prescription drug abuse however he is unsure if patient is currently using any other drugs than marijuana. 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 Labs: Initial lactic acid 8.2.  UDS positive for THC and benzodiazepines.  EtOH 216.  COVID negative.  Initial chest x-ray showed diffuse GGO throughout.  Due to patient's ongoing hypoxia and increasing dilatory requirements, repeat chest x-ray was performed showing worsening airspace disease.  Patient was also noted to have a temperature of 102.4 F.  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 10/13/2018: CT head without contrast: unremarkable    Micro Data:  09/27/2018 blood culture: no growth  Antimicrobials:  Zosyn 9/9 >> vanco 9/9 >>9/10  Interim history/subjective:  Cool temp goal reached around 0840.   Objective   Blood pressure (!) 110/92, pulse 88, temperature (!) 92.7 F (33.7 C), resp. rate (!) 32, height 5\' 10"  (1.778 m), weight 86.3 kg, SpO2 96 %. CVP:  [6 mmHg-10 mmHg] 10 mmHg  Vent Mode: PCV FiO2 (%):  [70 %-100 %] 100 % Set Rate:  [14 bmp-32 bmp] 32 bmp Vt Set:  [430 mL-580 mL] 430 mL PEEP:  [5 cmH20-14 cmH20] 8 cmH20 Plateau Pressure:  [25 cmH20] 25 cmH20   Intake/Output Summary (Last 24 hours) at 10/05/2018 0833 Last data filed at 10/05/2018 0700 Gross per 24 hour  Intake 1838.9 ml  Output 1190 ml  Net 648.9 ml   Filed Weights   10/21/2018 2256 10/05/18 0700  Weight: 90.7 kg 86.3 kg    Examination: General: Sedated, intubated HENT: Head atraumatic.  Eyes anti-icteric.  ETT in place Lungs: difficult to assess breath sounds with arctic sun in place however did not appreciate any adventitious breath sounds. Cardiovascular: Heart regular rate and rhythm.  No peripheral edema. Extremities cool. Abdomen: Soft, Nondistended. Extremities: Cool.  Arctic sun in place Neuro: Sedated. Nimbex induced paralysis. RAAS -5.  Pupillary miosis. BIS 7 on monitor. No withdrawal to pain.  Resolved Hospital Problem list     Assessment & Plan:  This is a 28 year old male status post witnessed cardiac arrest.  No CPR for about 5 to 8 minutes until EMS arrived.  Return of spontaneous circulation after approximately 15 minutes and 1 mg epi 1 mg Narcan.  Respiratory distress with chest x-ray consistent with edema.  Patient placed on cooling protocol.  Status post cardiac arrest. Etiology still unclear  however patient's father notes history of substance abuse. UDS + for benzo + THC. EKG not revealing arrhythmia or block. Plan:  Postarrest cooling protocol. Cooling phase complete,  maintenance phase initiated around 0840. Continue Nimbex and sedation with RASS goal of -5.  Will continue to monitor electrolytes, coagulation carefully.  ARDS/Acute respiratory failure with hypoxia and hypercarbia.  CXR and FiO2/PaO2 86 consistent with severe ARDS.  CVP 4 White count 12.2 with left shift. Likely reactive 2/2 arrest. Was noted to have a Tmax of 102 in ED. No growth on BC at this time. Received one dose of lasix early this morning. Plan:   ARDS ventilatory protocol Will continue zosyn for now. D/c vanc Lipase VAP protocol Repeat ABG  Acute renal injury. Likely due to hypoperfusion. GFR maintained at this time. AGMA. Delta gap -5. Ratio 0.4. Initial pH 7.0. Lactic acid 8.2-->5.8. ETOH 216. BG 321. Likely contributing to delta gap. Hypokalemic--2.9 this morning--hypothermia contributing CK 236 Plan Will try to avoid excessive volumes of IVF 2/2 suspicion for ARDS IV K replacement x4. Will recheck K after that. Goal 3.5  Substance abuse. Will continue thiamine and folic acid replacement.  Transaminitis--likely liver shock. Will continue to monitor    Best practice:  Diet: npo Pain/Anxiety/Delirium protocol (if indicated): Fentanyl, propofol, Versed, Nimbex VAP protocol (if indicated): Head of bed up 30 degrees DVT prophylaxis: Heparin 5000 units every 8 hours GI prophylaxis: Protonix Mobility: Bedrest Code Status: Full Family Communication: We will attempt to communicate patient's father later today Disposition: ICU  Labs   CBC: Recent Labs  Lab 10/19/18 2108 Oct 19, 2018 2134 10/05/18 0259 10/05/18 0328 10/05/18 0408 10/05/18 0442  WBC 12.2*  --   --  12.9*  --   --   NEUTROABS 3.5  --   --   --   --   --   HGB 14.9 14.6 17.7* 17.4* 17.3* 17.3*  HCT 44.6 43.0 52.0  50.7 51.0 51.0  MCV 96.7  --   --  90.1  --   --   PLT 283  --   --  295  --   --     Basic Metabolic Panel: Recent Labs  Lab 10/19/2018 2108 2018/10/19 2134 10/05/18 0259 10/05/18 0328 10/05/18 0408 10/05/18 0442 10/05/18 0601  NA 138 139 147* 143 148* 148* 143  K 2.4* 3.2* 2.9* 2.8* 2.6* 2.4* 2.5*  CL 100 106  --  111 112*  --  112*  CO2 19*  --   --  20*  --   --  19*  GLUCOSE 321* 339*  --  92 93  --  122*  BUN 10 9  --  12 14  --  12  CREATININE 1.32* 1.50*  --  1.59* 1.40*  --  1.30*  CALCIUM 9.1  --   --  8.9  --   --  8.8*  MG  --   --   --  1.9  --   --  2.3  PHOS  --   --   --  1.5*  --   --   --  GFR: Estimated Creatinine Clearance: 86.6 mL/min (A) (by C-G formula based on SCr of 1.3 mg/dL (H)). Recent Labs  Lab Dec 24, 2018 2108 Dec 24, 2018 2134 Dec 24, 2018 2317 10/05/18 0328 10/05/18 0341  WBC 12.2*  --   --  12.9*  --   LATICACIDVEN  --  8.2* 5.8*  --  3.3*    Liver Function Tests: Recent Labs  Lab Dec 24, 2018 2108 10/05/18 0341  AST 139* 380*  ALT 136* 251*  ALKPHOS 82 97  BILITOT 0.4 1.1  PROT 7.0 6.8  ALBUMIN 4.1 3.9   No results for input(s): LIPASE, AMYLASE in the last 168 hours. No results for input(s): AMMONIA in the last 168 hours.  ABG    Component Value Date/Time   PHART 7.242 (L) 10/05/2018 0442   PCO2ART 47.7 10/05/2018 0442   PO2ART 86.0 10/05/2018 0442   HCO3 20.5 10/05/2018 0442   TCO2 22 10/05/2018 0442   ACIDBASEDEF 7.0 (H) 10/05/2018 0442   O2SAT 95.0 10/05/2018 0442     Coagulation Profile: Recent Labs  Lab Dec 24, 2018 2108 10/05/18 0328  INR 1.0 0.9  0.9    Cardiac Enzymes: Recent Labs  Lab 10/05/18 0341  CKTOTAL 236    HbA1C: No results found for: HGBA1C  CBG: Recent Labs  Lab 10/05/18 0509 10/05/18 0606 10/05/18 0659 10/05/18 0734 10/05/18 0804  GLUCAP 112* 128* 102* 137* 151*    Review of Systems:   Unable to obtain due to encephalopathic state  Surgical History    Past Surgical History:   Procedure Laterality Date  . SPLENECTOMY       Social History   reports that he has been smoking cigarettes. He has been smoking about 0.50 packs per day. He has never used smokeless tobacco. He reports current alcohol use. He reports current drug use. Drug: Marijuana.   Family History   Unable to be obtained due to encephalopathic state.  Allergies No Known Allergies   Home Medications  Prior to Admission medications   Not on File     Elige RadonYLEE Chynna Buerkle, MD INTERNAL MEDICINE RESIDENT PGY-1 @TODAY @ 8:33 AM

## 2018-10-05 NOTE — Procedures (Signed)
Central Venous Catheter Insertion Procedure Note Eric Archer [id number] 12-05-1990  Procedure: Insertion of Central Venous Catheter Indications: Assessment of intravascular volume, Drug and/or fluid administration and Frequent blood sampling  Procedure Details Consent: Unable to obtain consent because of altered level of consciousness. Time Out: Verified patient identification, verified procedure, site/side was marked, verified correct patient position, special equipment/implants available, medications/allergies/relevent history reviewed, required imaging and test results available.  Performed  Maximum sterile technique was used including antiseptics, cap, gloves, gown, hand hygiene, mask and sheet. Skin prep: Chlorhexidine; local anesthetic administered 3 ml 1% lido A antimicrobial bonded/coated triple lumen catheter was placed in the right internal jugular vein using the Seldinger technique to 17 cm.  Line sutured.  Biopatch placed and sterile dressing applied.   Evaluation Blood flow good Complications: No apparent complications Patient did tolerate procedure well. Chest X-ray ordered to verify placement.  CXR: pending.  Procedure performed with ultrasound guidance for real time vessel cannulation.      Kennieth Rad, MSN, AGACNP-BC [place] [facility] Pgr: [phone] or if no answer [phone] 10/05/2018, 3:52 AM

## 2018-10-05 NOTE — Procedures (Signed)
Patient Name: Eric Archer  MRN: [id number]  Epilepsy Attending: Lora Havens  Referring Physician/Provider: Jennelle Human, NP Date: 10/05/2018 Duration: 33 mins  Patient history: 28 year old male with cardiac arrest, currently on hypothermia protocol.  EEG to evaluate for seizures.  Level of alertness: Comatose  Meds during EEG study: Versed, propofol, Nimbex  Technical aspects: This EEG study was done with scalp electrodes positioned according to the 10-20 International system of electrode placement. Electrical activity was acquired at a sampling rate of 500Hz  and reviewed with a high frequency filter of 70Hz  and a low frequency filter of 1Hz . EEG data were recorded continuously and digitally stored.   DESCRIPTION: EEG showed generalized burst suppression throughout the recording with intermittent 2 to 3-second bursts of high amplitude polymorphic sharply contoured 5 to 6 Hz theta activity.  Of note patient was on Nimbex and no clinical seizures/myoclonic jerks were seen during these bursts.  Hyperventilation and photic stimulation were not performed.  IMPRESSION: This study is suggestive of profound diffuse encephalopathy, likely secondary to diffuse anoxic/hypoxic brain injury.  Frequent bursts of diffuse sharply contoured activity were noted without concomitant clinical signs while patient was on Nimbex.  These discharges are on the ictal-interictal continuum.

## 2018-10-05 NOTE — Progress Notes (Signed)
EEG completed, results pending. 

## 2018-10-05 NOTE — ED Notes (Signed)
Eric Archer [phone]. Patient's father Contact

## 2018-10-05 NOTE — H&P (Signed)
NAME:  Eric Archer, MRN:  [id number], DOB:  [birth year], LOS: 1 ADMISSION DATE:  10/03/2018, CONSULTATION DATE:  10/06/2018 REFERRING MD:  Dr. Estell HarpinZammit, CHIEF COMPLAINT:  Cardiac arrest  Brief History   [age] yoM presenting from home with witnessed asystolic cardiac arrest after collapsed holding his throat.  Hx of substance abuse.  Total down time ~24 mins.  Remained unresponsive in ER, CTH neg, TTM protocol initiated.   History of present illness   HPI obtained from medical chart review as patient is intubated and sedated on mechanical ventilation.   Additionally spoke with patient's father by phone.   28 year old with history of tobacco/ ETOH/ substance abuse (unclear what beside marijuana) presenting to [facility] ER by EMS after cardiac arrest.   Reportedly witnessed collapse in front of girlfriend.  Little history is known or what substance.  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  States he has known issues with prescription drug abuse but does not know what drugs, other than marijuana.  Reported 9 minutes for EMS to arrive.  No bystander CPR given.  EMS found in asystole. ACLS measures and narcan given with ROSC after 15 mins.  Remained unresponsive and apneic but hemodynamically stable afterwards, if not hypertensive.  In ER, Brooke DareKing airway replaced with ETT.  Labs noted for K 2.4, CO2 19. G;icpse 321, sCr 1.32, AG 19, AST/ ALT 139/ 136, hs trop 5-89, Lactic 8.2- > 5.8, WBC 12.2, normal coags, CTH negative, UDS positive for THC and benzodiazepines, ETOH 216, COVID neg.  Initial CXR showed diffuse GGO throughout with satisfactory placement of ETT/ OGT.  Patient ongoing hypoxia with increasing ventilator requirements with repeat CXR worsening airspace disease.   Temperature spiked to 102.4.  Blood cultures sent and treated with vancomycin and zosyn, two liters of NS, 2 runs of KCL, and ice packs started.  Per transport team, patient has  remained unresponsive, but tachypneic requiring sedation for ventilatory synchrony. [facility], PCCM accepting.    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  No spontaneous movement, sedation has been for ventilator compliance/ ongoing tachypnea   Objective   Blood pressure (!) 155/115, pulse (!) 143, temperature (!) 100.8 F (38.2 C), resp. rate (!) 34, height 5\' 10"  (1.778 m), weight 90.7 kg, SpO2 (!) 87 %.    Vent Mode: PCV FiO2 (%):  [70 %-100 %] 100 % Set Rate:  [14 bmp-16 bmp] 14 bmp Vt Set:  [580 mL] 580 mL PEEP:  [5 cmH20-14 cmH20] 14 cmH20   Intake/Output Summary (Last 24 hours) at 10/05/2018 0102 Last data filed at 10/13/2018 2329 Gross per 24 hour  Intake 1511.87 ml  Output -  Net 1511.87 ml   Filed Weights   10/25/2018 2256  Weight: 90.7 kg    Examination: General:  Young WM, critically ill on MV with significant WOB on continuous sedation HEENT: MM pink/moist, pupils pinpoint, absent corneal, no cough Neuro: unresponsive  CV: ST, distant heart sounds PULM:  Initially very labored and dyssynchronous, but after adequately sedated, MV supported breaths, lungs coarse GI: soft, hypoBS Extremities: cool/ dry Skin: mottled, multiple tattoos  Resolved Hospital Problem list    Assessment & Plan:   Cardiac arrest - in the setting of suspected OD with substance abuse hx - Start TTM, goal 33 degrees. - Place CVL, arterial line. -  MAP goal > 65 - Assess CVP's - Trend troponin, TTE in am  - Check lactate, EKG (monitor QTc),  tylenol, salicylate, and osmolarity given AG  to check osmolar gap.  If elevated, consider add volatile/ propylene panel - recheck LFTs in am    Hypoxic respiratory failure - in the setting of cardiac arrest Pulmonary Edema Tobacco / marijuana abuse.  Unknown vaping hx.  - Full vent support PRVC 8 cc/kg, rate 32 - Wean FiO2/ PEEP for goal sat >94 -99 - VAP prevention measures. - Hold SBT until off NMB. - CXR and ABG now - consider diuresis    At risk for anoxic encephalopathy with prolonged cardiac arrest  ETOH intoxication, UDS + for benzo / THC, unclear if/what other substances - initial CTH negative, consider MRI after rewarming if no significant neurological improvement.  - Sedation:  Fentanyl / versed gtt - RASS goal: -5 until off NMB - Hold daily WUA until off NMB. - Assess EEG in am  - Neuro consult once rewarmed.   AKI Severe AGMA/ lactic acidosis Hypokalemia  At risk for multiple metabolic derangements during cooling. - hold LR - check urine lytes, osmolar gap - consider diuresis given pulmonary edema/ hypertension - BMP now q2hrs x 4 then q4hrs. - check mag - s/p K replete in ER, will likely need more pending BMP   Hyperglycemia  - ICU hyperglycemia protocol.  Hourly CBG, may need insulin gtt   Leukocytosis - suspected aspiration in ER.  S/p vanc and zosyn.  CXR appears more pulmonary edema - check PCT and trend - send trach asp and follow BC's - hold further abx for now/ monitor clinically   Best practice:  Diet: NPO Pain/Anxiety/Delirium protocol (if indicated): as above VAP protocol (if indicated): yes DVT prophylaxis: SCDs, heparin SQ GI prophylaxis: PPI Glucose control: ICU protocol  Mobility: BR Code Status: Full  Family Communication: Patient father, Gala Romney [phone] called by phone and updated.  He is unable to give much insight into patient's substance abuse hx or events other than what he was told.   Disposition: ICU   Labs   CBC:  Recent Labs  Lab 10/08/2018 2108 2018/10/08 2134  WBC 12.2*  --   NEUTROABS 3.5  --   HGB 14.9 14.6  HCT 44.6 43.0  MCV 96.7  --   PLT 283  --     Basic Metabolic Panel: Recent Labs  Lab Oct 08, 2018 2108 10-08-2018 2134  NA 138 139  K 2.4* 3.2*  CL 100 106  CO2 19*  --   GLUCOSE 321* 339*  BUN 10 9  CREATININE 1.32* 1.50*  CALCIUM 9.1  --    GFR: Estimated Creatinine Clearance: 82.3 mL/min (A) (by C-G formula based on SCr of 1.5 mg/dL (H)). Recent Labs  Lab 10-08-18 2108 2018-10-08 2134 October 08, 2018 2317  WBC 12.2*  --   --   LATICACIDVEN  --  8.2* 5.8*    Liver Function Tests: Recent Labs  Lab 10/08/18 2108  AST 139*  ALT 136*  ALKPHOS 82  BILITOT 0.4  PROT 7.0  ALBUMIN  4.1   No results for input(s): LIPASE, AMYLASE in the last 168 hours. No results for input(s): AMMONIA in the last 168 hours.  ABG    Component Value Date/Time   PHART 7.190 (LL) 10-30-18 2357   PCO2ART 39.7 Oct 30, 2018 2357   PO2ART 63.7 (L) 10/30/2018 2357   HCO3 14.4 (L) 10/30/2018 2357   TCO2 17 (L) 10-30-18 2134   ACIDBASEDEF 12.0 (H) Oct 30, 2018 2357   O2SAT 85.6 30-Oct-2018 2357     Coagulation Profile: Recent Labs  Lab October 30, 2018 2108  INR 1.0    Cardiac Enzymes: No results for input(s): CKTOTAL, CKMB, CKMBINDEX, TROPONINI in the last 168 hours.  HbA1C: No results found for: HGBA1C  CBG: No results for input(s): GLUCAP in the last 168 hours.  Review of Systems:   Unable  Past Medical History  He,  has no past medical history on file.   Surgical History    Past Surgical History:  Procedure Laterality Date  . SPLENECTOMY       Social History   reports that he has been smoking cigarettes. He has been smoking about 0.50 packs per day. He has never used smokeless tobacco. He reports current alcohol use. He reports current drug use. Drug: Marijuana.   Family History   His family history is not on file.   Allergies No Known Allergies   Home Medications  Prior to  Admission medications   Not on File     I spent 70 minutes in direct patient care including reviewing data,  discussing with other providers, assessment, planning and stabilization and documentation. Time is exclusive to this patient and does not include procedures.    Kennieth Rad, MSN, AGACNP-BC  Pulmonary & Critical Care Pgr: [phone] or if no answer [phone] 10/05/2018, 4:27 AM

## 2018-10-05 NOTE — Procedures (Signed)
Arterial Catheter Insertion Procedure Note Eric Archer [id number] Jun 05, 1990  Procedure: Insertion of Arterial Catheter  Indications: Blood pressure monitoring and Frequent blood sampling  Procedure Details Consent: Unable to obtain consent because of emergent medical necessity. Time Out: Verified patient identification, verified procedure, site/side was marked, verified correct patient position, special equipment/implants available, medications/allergies/relevent history reviewed, required imaging and test results available.  Performed  Maximum sterile technique was used including antiseptics, cap, gloves, gown, hand hygiene, mask and sheet. Skin prep: Chlorhexidine; local anesthetic administered 20 gauge catheter was inserted into left radial artery using the Seldinger technique. ULTRASOUND GUIDANCE USED: NO Evaluation Blood flow good; BP tracing good. Complications: No apparent complications.   Graciella Freer 10/05/2018

## 2018-10-05 NOTE — Progress Notes (Signed)
CRITICAL VALUE ALERT  Critical Value:  K - 5.4  Date & Time Notied:  10/05/18 8:45 PM  Provider Notified: Dr. Lucile Shutters, MD  Orders Received/Actions taken: No orders received

## 2018-10-05 NOTE — ED Notes (Addendum)
Date and time results received: 10/05/18 12:09 AM  (use smartphrase ".now" to insert current time)  Test:ph 7.190   Name of Provider Notified: glick  Orders Received? Or Actions Taken?:

## 2018-10-05 NOTE — Progress Notes (Signed)
Interior Progress Note Patient Name: Eric Archer DOB: 1990-05-19 MRN: [id number]   Date of Service  10/05/2018  HPI/Events of Note  K+ 5.4, Pt received significant K+ replacement in past 24 hours. Creatinine 1.34.  eICU Interventions  Change LR to NS maintenance infusion, follow up repeat BMET at 2 AM, d/c further K+ replacement.        Kerry Kass Moncerrat Burnstein 10/05/2018, 9:31 PM

## 2018-10-05 NOTE — ED Notes (Signed)
CRITICAL VALUE ALERT  Critical Value:  Lactic acid 5.8  Date & Time Notied:  10/05/2018 2352  Provider Notified: dr. Roxanne Mins  Orders Received/Actions taken: see chart

## 2018-10-05 NOTE — ED Notes (Signed)
Update given to [facility] Floor that patient had left by [facility]. More ice packs applied to patient.

## 2018-10-05 NOTE — Plan of Care (Signed)
Plan of care reviewed. Unable to do education to do sedation. Family not present at this time.

## 2018-10-05 NOTE — Progress Notes (Signed)
eLink Physician-Brief Progress Note Patient Name: Eric Archer DOB: 08/28/1990 MRN: [id number]   Date of Service  10/05/2018  HPI/Events of Note  28 yo male with witnessed out of hospital cardiac arrest suspected to be due to drugs. He is comatose and intubated s/p arrest.  eICU Interventions  New patient evaluation completed.        [name] 10/05/2018, 2:24 AM

## 2018-10-06 ENCOUNTER — Inpatient Hospital Stay (HOSPITAL_COMMUNITY): Payer: Medicaid Other

## 2018-10-06 ENCOUNTER — Other Ambulatory Visit: Payer: Self-pay

## 2018-10-06 ENCOUNTER — Encounter (HOSPITAL_COMMUNITY): Payer: Self-pay

## 2018-10-06 DIAGNOSIS — J80 Acute respiratory distress syndrome: Secondary | ICD-10-CM

## 2018-10-06 DIAGNOSIS — I469 Cardiac arrest, cause unspecified: Secondary | ICD-10-CM

## 2018-10-06 LAB — CBC
HCT: 45.6 % (ref 39.0–52.0)
Hemoglobin: 16 g/dL (ref 13.0–17.0)
MCH: 31.3 pg (ref 26.0–34.0)
MCHC: 35.1 g/dL (ref 30.0–36.0)
MCV: 89.1 fL (ref 80.0–100.0)
Platelets: 233 10*3/uL (ref 150–400)
RBC: 5.12 MIL/uL (ref 4.22–5.81)
RDW: 13.5 % (ref 11.5–15.5)
WBC: 24.7 10*3/uL — ABNORMAL HIGH (ref 4.0–10.5)
nRBC: 0 % (ref 0.0–0.2)

## 2018-10-06 LAB — POCT I-STAT, CHEM 8
BUN: 24 mg/dL — ABNORMAL HIGH (ref 6–20)
BUN: 24 mg/dL — ABNORMAL HIGH (ref 6–20)
BUN: 24 mg/dL — ABNORMAL HIGH (ref 6–20)
BUN: 22 mg/dL — ABNORMAL HIGH (ref 6–20)
BUN: 23 mg/dL — ABNORMAL HIGH (ref 6–20)
BUN: 20 mg/dL (ref 6–20)
Calcium, Ion: 1.14 mmol/L — ABNORMAL LOW (ref 1.15–1.40)
Calcium, Ion: 1.13 mmol/L — ABNORMAL LOW (ref 1.15–1.40)
Calcium, Ion: 1.11 mmol/L — ABNORMAL LOW (ref 1.15–1.40)
Calcium, Ion: 1.12 mmol/L — ABNORMAL LOW (ref 1.15–1.40)
Calcium, Ion: 1.18 mmol/L (ref 1.15–1.40)
Calcium, Ion: 1.06 mmol/L — ABNORMAL LOW (ref 1.15–1.40)
Chloride: 109 mmol/L (ref 98–111)
Chloride: 110 mmol/L (ref 98–111)
Chloride: 112 mmol/L — ABNORMAL HIGH (ref 98–111)
Chloride: 110 mmol/L (ref 98–111)
Chloride: 111 mmol/L (ref 98–111)
Chloride: 113 mmol/L — ABNORMAL HIGH (ref 98–111)
Creatinine, Ser: 1 mg/dL (ref 0.61–1.24)
Creatinine, Ser: 1 mg/dL (ref 0.61–1.24)
Creatinine, Ser: 1 mg/dL (ref 0.61–1.24)
Creatinine, Ser: 1.1 mg/dL (ref 0.61–1.24)
Creatinine, Ser: 1.1 mg/dL (ref 0.61–1.24)
Creatinine, Ser: 0.9 mg/dL (ref 0.61–1.24)
Glucose, Bld: 124 mg/dL — ABNORMAL HIGH (ref 70–99)
Glucose, Bld: 105 mg/dL — ABNORMAL HIGH (ref 70–99)
Glucose, Bld: 103 mg/dL — ABNORMAL HIGH (ref 70–99)
Glucose, Bld: 107 mg/dL — ABNORMAL HIGH (ref 70–99)
Glucose, Bld: 113 mg/dL — ABNORMAL HIGH (ref 70–99)
Glucose, Bld: 91 mg/dL (ref 70–99)
HCT: 49 % (ref 39.0–52.0)
HCT: 46 % (ref 39.0–52.0)
HCT: 41 % (ref 39.0–52.0)
HCT: 42 % (ref 39.0–52.0)
HCT: 40 % (ref 39.0–52.0)
HCT: 40 % (ref 39.0–52.0)
Hemoglobin: 16.7 g/dL (ref 13.0–17.0)
Hemoglobin: 15.6 g/dL (ref 13.0–17.0)
Hemoglobin: 13.9 g/dL (ref 13.0–17.0)
Hemoglobin: 14.3 g/dL (ref 13.0–17.0)
Hemoglobin: 13.6 g/dL (ref 13.0–17.0)
Hemoglobin: 13.6 g/dL (ref 13.0–17.0)
Potassium: 3.8 mmol/L (ref 3.5–5.1)
Potassium: 4.2 mmol/L (ref 3.5–5.1)
Potassium: 4.1 mmol/L (ref 3.5–5.1)
Potassium: 4.1 mmol/L (ref 3.5–5.1)
Potassium: 4.1 mmol/L (ref 3.5–5.1)
Potassium: 3.7 mmol/L (ref 3.5–5.1)
Sodium: 146 mmol/L — ABNORMAL HIGH (ref 135–145)
Sodium: 145 mmol/L (ref 135–145)
Sodium: 145 mmol/L (ref 135–145)
Sodium: 146 mmol/L — ABNORMAL HIGH (ref 135–145)
Sodium: 144 mmol/L (ref 135–145)
Sodium: 147 mmol/L — ABNORMAL HIGH (ref 135–145)
TCO2: 21 mmol/L — ABNORMAL LOW (ref 22–32)
TCO2: 22 mmol/L (ref 22–32)
TCO2: 23 mmol/L (ref 22–32)
TCO2: 22 mmol/L (ref 22–32)
TCO2: 24 mmol/L (ref 22–32)
TCO2: 21 mmol/L — ABNORMAL LOW (ref 22–32)

## 2018-10-06 LAB — HEPATIC FUNCTION PANEL
ALT: 183 U/L — ABNORMAL HIGH (ref 0–44)
AST: 111 U/L — ABNORMAL HIGH (ref 15–41)
Albumin: 3.6 g/dL (ref 3.5–5.0)
Alkaline Phosphatase: 67 U/L (ref 38–126)
Bilirubin, Direct: 0.4 mg/dL — ABNORMAL HIGH (ref 0.0–0.2)
Indirect Bilirubin: 1 mg/dL — ABNORMAL HIGH (ref 0.3–0.9)
Total Bilirubin: 1.4 mg/dL — ABNORMAL HIGH (ref 0.3–1.2)
Total Protein: 7.3 g/dL (ref 6.5–8.1)

## 2018-10-06 LAB — POCT I-STAT 7, (LYTES, BLD GAS, ICA,H+H)
Acid-base deficit: 3 mmol/L — ABNORMAL HIGH (ref 0.0–2.0)
Bicarbonate: 22.1 mmol/L (ref 20.0–28.0)
Calcium, Ion: 1.22 mmol/L (ref 1.15–1.40)
HCT: 45 % (ref 39.0–52.0)
Hemoglobin: 15.3 g/dL (ref 13.0–17.0)
O2 Saturation: 96 %
Patient temperature: 33
Potassium: 4.5 mmol/L (ref 3.5–5.1)
Sodium: 143 mmol/L (ref 135–145)
TCO2: 23 mmol/L (ref 22–32)
pCO2 arterial: 34.3 mmHg (ref 32.0–48.0)
pH, Arterial: 7.398 (ref 7.350–7.450)
pO2, Arterial: 68 mmHg — ABNORMAL LOW (ref 83.0–108.0)

## 2018-10-06 LAB — BASIC METABOLIC PANEL
Anion gap: 9 (ref 5–15)
Anion gap: 10 (ref 5–15)
BUN: 21 mg/dL — ABNORMAL HIGH (ref 6–20)
BUN: 24 mg/dL — ABNORMAL HIGH (ref 6–20)
CO2: 20 mmol/L — ABNORMAL LOW (ref 22–32)
CO2: 21 mmol/L — ABNORMAL LOW (ref 22–32)
Calcium: 8.1 mg/dL — ABNORMAL LOW (ref 8.9–10.3)
Calcium: 8.5 mg/dL — ABNORMAL LOW (ref 8.9–10.3)
Chloride: 113 mmol/L — ABNORMAL HIGH (ref 98–111)
Chloride: 111 mmol/L (ref 98–111)
Creatinine, Ser: 1.08 mg/dL (ref 0.61–1.24)
Creatinine, Ser: 1.22 mg/dL (ref 0.61–1.24)
GFR calc Af Amer: >60 mL/min (ref >60)
GFR calc Af Amer: >60 mL/min (ref >60)
GFR calc non Af Amer: >60 mL/min (ref >60)
GFR calc non Af Amer: >60 mL/min (ref >60)
Glucose, Bld: 136 mg/dL — ABNORMAL HIGH (ref 70–99)
Glucose, Bld: 120 mg/dL — ABNORMAL HIGH (ref 70–99)
Potassium: 5.1 mmol/L (ref 3.5–5.1)
Potassium: 4.4 mmol/L (ref 3.5–5.1)
Sodium: 142 mmol/L (ref 135–145)
Sodium: 142 mmol/L (ref 135–145)

## 2018-10-06 LAB — URINE CULTURE: Culture: NO GROWTH

## 2018-10-06 LAB — LACTIC ACID, PLASMA: Lactic Acid, Venous: 2.5 mmol/L (ref 0.5–1.9)

## 2018-10-06 LAB — PROTIME-INR
INR: 1.1 (ref 0.8–1.2)
Prothrombin Time: 13.7 seconds (ref 11.4–15.2)

## 2018-10-06 LAB — GLUCOSE, CAPILLARY
Glucose-Capillary: 80 mg/dL (ref 70–99)
Glucose-Capillary: 117 mg/dL — ABNORMAL HIGH (ref 70–99)
Glucose-Capillary: 86 mg/dL (ref 70–99)
Glucose-Capillary: 130 mg/dL — ABNORMAL HIGH (ref 70–99)
Glucose-Capillary: 111 mg/dL — ABNORMAL HIGH (ref 70–99)
Glucose-Capillary: 124 mg/dL — ABNORMAL HIGH (ref 70–99)
Glucose-Capillary: 91 mg/dL (ref 70–99)
Glucose-Capillary: 110 mg/dL — ABNORMAL HIGH (ref 70–99)

## 2018-10-06 LAB — HEPATITIS PANEL, ACUTE
HCV Ab: <0.1 s/co ratio (ref 0.0–0.9)
Hep A IgM: NEGATIVE
Hep B C IgM: NEGATIVE
Hepatitis B Surface Ag: NEGATIVE

## 2018-10-06 LAB — TROPONIN I (HIGH SENSITIVITY): Troponin I (High Sensitivity): 144 ng/L (ref <18)

## 2018-10-06 LAB — PROCALCITONIN: Procalcitonin: 79.56 ng/mL

## 2018-10-06 LAB — APTT: aPTT: 36 seconds (ref 24–36)

## 2018-10-06 MED ORDER — HYDRALAZINE HCL 20 MG/ML IJ SOLN
10.0000 mg | Freq: Four times a day (QID) | INTRAMUSCULAR | Status: DC | PRN
  Administered 2018-10-06: 19:00:00 20 mg via INTRAVENOUS
  Administered 2018-10-06 – 2018-10-07 (×2): 10 mg via INTRAVENOUS
  Administered 2018-10-07: 20 mg via INTRAVENOUS
  Administered 2018-10-08: 14:00:00 10 mg via INTRAVENOUS
  Administered 2018-10-08: 20 mg via INTRAVENOUS
  Administered 2018-10-08: 14:00:00 10 mg via INTRAVENOUS
  Filled 2018-10-06 (×8): qty 1

## 2018-10-06 NOTE — Progress Notes (Signed)
Patient rewarmed to 36.0 Celsius at 2030. Nimbex turned off. 10/06/18 8:32 PM

## 2018-10-06 NOTE — Progress Notes (Signed)
NAME:  Eric Archer, MRN:  [id number], DOB:  09-26-1990, LOS: 2 ADMISSION DATE:  10/06/2018, CONSULTATION DATE:  10/23/2018 REFERRING MD:  Roderic Palau, CHIEF COMPLAINT:  Cardiac arrest  Brief History   28 year old male with history of substance abuse presented to witnessed cardiac arrest--no bystander CPR performed.  EMS arrived and 5 to 8 minutes where patient was found to be in asystole and CPR was initiated. ROSC achieved 15 minutes later for a total time until resuscitation of 20 to 23 minutes.  History of present illness   This is a 28 year old male with past medical history significant for substance abuse presents the ED on 10/16/2018 via EMS status post cardiac arrest.  Cardiac arrest was witnessed by patient's girlfriend.  No bystander CPR was initiated at that time.  Is noted that took EMS about 5 to 8 minutes to arrive at the scene followed by about 15 minutes of CPR after which time ROSC was obtained.  During that time 1 mg Narcan and 1 mg epi.  Patient did not resume spontaneous breathing. At time of presentation is unclear which substances or abuse if any.  Father does note history of prescription drug abuse however he is unsure if patient is currently using any other drugs than marijuana. 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 Labs: Initial lactic acid 8.2.  UDS positive for THC and benzodiazepines.  EtOH 216.  COVID negative.  Initial chest x-ray showed diffuse GGO throughout.  Due to patient's ongoing hypoxia and increasing dilatory requirements, repeat chest x-ray was performed showing worsening airspace disease.  Patient was also noted to have a temperature of 102.4 F.  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  10/21/2018: CT head without contrast: unremarkable  10/05/18 EEG: profound diffuse encephalopathy--likely secondary to diffuse anoxic/hypoxic brain injury    Micro Data:  10/21/2018 blood culture: no growth  Antimicrobials:  Zosyn 9/9 >> vanco 9/9 >>9/10  Interim history/subjective:  Rewarming process started around 0840 this morning.  Objective   Blood pressure (!) 165/121, pulse 73, temperature (!) 90.5 F (32.5 C), temperature source Bladder, resp. rate (!) 36, height 5' 10"  (1.778 m), weight 86.9 kg, SpO2 100 %. CVP:  [2 mmHg-12 mmHg] 7 mmHg  Vent Mode: PRVC FiO2 (%):  [50 %-100 %] 50 % Set Rate:  [32 bmp-34 bmp] 34 bmp Vt Set:  [440 mL] 440 mL PEEP:  [10 cmH20] 10 cmH20 Plateau Pressure:  [22 cmH20-28 cmH20] 28 cmH20   Intake/Output Summary (Last 24 hours) at 10/06/2018 0821 Last data filed at 10/06/2018 0800 Gross per 24 hour  Intake 1691.2 ml  Output 1554 ml  Net 137.2 ml   Filed Weights   10/01/2018 2256 10/05/18 0700 10/06/18 0600  Weight: 90.7 kg 86.3 kg 86.9 kg    Examination: General: Sedated, intubated HENT: Head atraumatic.  Eyes anti-icteric.  ETT in place Lungs: wheezes appreciated on anterior chest. Difficult to assess breath sounds with arctic sun placement Cardiovascular: Heart regular rate and rhythm.  No peripheral edema. Extremities cool. Abdomen: Soft, Nondistended. Dark brown substance from OG Neuro: Sedated. Sluggish pupillary  reflex.  Pupillary miosis. No withdrawal to pain consistent with nimbex and sedation. Skin: no obvious skin lesions on limited exam. Did not visualize findings consistent with track marks on antecubitals or between toes  Resolved Hospital Problem list   Acute renal injury.  AGMA  Assessment & Plan:  This is a 28 year old male  status post witnessed cardiac arrest.  No CPR for about 5 to 8 minutes until EMS arrived.  Return of spontaneous circulation after approximately 15 minutes and 1 mg epi 1 mg Narcan.  Respiratory distress with chest x-ray consistent with edema.  Patient placed on cooling protocol.  Status post cardiac arrest of unknown etiology. Dysrhythmia vs toxin induced vs heart failure vs  Infection. No apparent dysrhythmias on EKG. UDS + for benzo and THC. 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 Left ventricular diffuse hypokinesis.  Ischemic injury vs infectious (endocarditis/pericarditis) vs chronic. May need to consider TEE as valves were unable to be assessed on yesterday's echo. Especially in the setting of potential substance abuse, 102F in ED and severely reduced EF, endocarditis can not be ruled out. Nursing notes 2-3 degree celcius difference in artic sun possibly indicating a high fever Hypertension. Bps 160s-200s Plan:  Postarrest cooling protocol. Will start rewarming process today. BMP q2H.   Cardiology consulted Prn hydralazine   ARDS/Acute respiratory failure with hypoxia and hypercarbia.  CXR and FiO2/PaO2 86 consistent with severe ARDS.  White count 12-->25. Procalc 40-->80. Was noted to have a Tmax of 102 on admission. COVID neg. No growth on BC at this time. Lipase normal ABG this morning: Arterial Blood Gas result:  pO2 68; pCO2 23; pH 7.39;  HCO3 22, %O2 Sat 96.  CXR this morning indicitive of improvement in bilateral opacities Plan:   ARDS ventilatory protocol Will continue zosyn for now.  VAP protocol  Acute Encephalopathy in setting of cardiac arrest with unknown down time. UDS + for THC and  EEG consistent with anoxic brain injury.  CT head on admission neg Plan Neurology consulted LTM in process   Hyperkalemic--5.4-->5.1. Patient switched to NS overnight for the hyperkalemia. Will hold off on further K replacement for now as he will be starting the rewarming  process today.  Plan Will try to avoid excessive volumes of IVF 2/2 suspicion for ARDS Repeat BMP now then q2h  Substance abuse. Will continue thiamine and folic acid replacement. No track marks visualized Transaminitis--likely liver shock. HAV, HBV, HCV neg. Lipase neg.  Hepatic Function Latest Ref Rng & Units 10/06/2018 10/05/2018 10/18/2018  Total Protein 6.5 - 8.1 g/dL 7.3 6.8 7.0  Albumin 3.5 - 5.0 g/dL 3.6 3.9 4.1  AST 15 - 41 U/L 111(H) 380(H) 139(H)  ALT 0 - 44 U/L 183(H) 251(H) 136(H)  Alk Phosphatase 38 - 126 U/L 67 97 82  Total Bilirubin 0.3 - 1.2 mg/dL 1.4(H) 1.1 0.4  Bilirubin, Direct 0.0 - 0.2 mg/dL 0.4(H) 0.4(H) -   Plan: will obtain KUB to eval for bowel obstruction given bilious nature of OG product.  The indirect hyperbilirubinemia leans more towards a pre hepatic process such as hemolysis. Will consider RUQ [place] for evaluation of this.   Best practice:  Diet: npo Pain/Anxiety/Delirium protocol (if indicated): Fentanyl, propofol, Versed, Nimbex VAP protocol (if indicated): Head of bed up 30 degrees DVT prophylaxis: Heparin 5000 units every 8 hours GI prophylaxis: Protonix Mobility: Bedrest Code Status: Full Family Communication: We will attempt to communicate patient's father later today Disposition: ICU   Mitzi Hansen, MD INTERNAL MEDICINE  RESIDENT PGY-1 @TODAY @ 8:21 AM

## 2018-10-06 NOTE — Progress Notes (Signed)
LTM maintenance completed; checked and reprepped Fp1 and Fp2. No skin breakdown was seen.

## 2018-10-06 NOTE — Procedures (Addendum)
Patient Name: Eric Archer  MRN: [id number]  Epilepsy Attending: Lora Havens  Referring Physician/Provider: Jennelle Human, NP Duration: 10/05/2018 1530 to 10/06/2018 1529  Patient history: 28 year old male with cardiac arrest, currently on hypothermia protocol.  EEG to evaluate for seizures.  Level of alertness: Comatose  Meds during EEG study: Versed, propofol, Nimbex  Technical aspects: This EEG study was done with scalp electrodes positioned according to the 10-20 International system of electrode placement. Electrical activity was acquired at a sampling rate of 500Hz  and reviewed with a high frequency filter of 70Hz  and a low frequency filter of 1Hz . EEG data were recorded continuously and digitally stored.   DESCRIPTION: EEG initially showed generalized burst suppression with 15-20 seconds of suppression and 2 to 3-second bursts of high amplitude polymorphic sharply contoured 5 to 6 Hz theta activity which gradually changed to 3-5 seconds of suppression and 2-4seconds of bursts.  Of note patient was on Nimbex and no clinical seizures/myoclonic jerks were seen during these bursts.  Hyperventilation and photic stimulation were not performed.  IMPRESSION: This study is suggestive of profound diffuse encephalopathy, likely secondary to diffuse anoxic/hypoxic brain injury.  Frequent bursts of diffuse sharply contoured activity were noted without concomitant clinical signs while patient was on Nimbex.  These discharges are on the ictal-interictal continuum.   The EEG appears improved compared to previous study.  10/06/2018   10/05/2018

## 2018-10-06 NOTE — Consult Note (Addendum)
NEURO HOSPITALIST CONSULT NOTE   Requestig physician: Dr. Lamonte Sakai  Reason for Consult:evaluate for seizures  History obtained from: Chart  HPI:                                                                                                                                          Eric Archer is an 28 y.o. male  With tobacco/ ETOH and substance abuse history (primary prescription drugs and marijuana)  transfer from [facility] for TTM status post witnessed cardiac arrest. Neurology consulted for abnormal EEG, hypoxic/anoxic brain injury. Patient unable to provide any history.  No family at bedside.  Per chart:  Patient collapsed in front of girlfriend. Father reported that patient was in his normal state of health earlier 9/9. He has known issues with prescription drug abuse, but unknown which drugs except patient does use marijuana. Patient was found by EMS in asystole. No bystander CPR given. ACLS and narcan given with ROSC. Patient was intubated with king airway in the field.   Downtime 24 minutes.  Initial evaluation by critical care-patient was already intubated, hypothermic and dyssynchronous to the vent on arrival.  9/9 CTH: u enhanced; normal head CT.   No past medical history on file.  Past Surgical History:  Procedure Laterality Date  . SPLENECTOMY      No family history on file.          Patient unable to provide.  Social History:  reports that he has been smoking cigarettes. He has been smoking about 0.50 packs per day. He has never used smokeless tobacco. He reports current alcohol use. He reports current drug use. Drug: Marijuana.  No Known Allergies  MEDICATIONS:                                                                                                                     Prior to Admission:  No medications prior to admission.   Scheduled: . artificial tears  1 application Both Eyes G9E  . chlorhexidine gluconate (MEDLINE KIT)  15 mL  Mouth Rinse BID  . Chlorhexidine Gluconate Cloth  6 each Topical Daily  . cisatracurium  0.1 mg/kg Intravenous Once  . folic acid  1 mg Intravenous Daily  . heparin  5,000 Units Subcutaneous Q8H  .  mouth rinse  15 mL Mouth Rinse 10 times per day  . pantoprazole (PROTONIX) IV  40 mg Intravenous QHS  . sodium chloride flush  10-40 mL Intracatheter Q12H  . thiamine  100 mg Intravenous Daily   Continuous: . sodium chloride 50 mL/hr at 10/06/18 1000  . cisatracurium (NIMBEX) infusion 1 mcg/kg/min (10/06/18 1000)  . fentaNYL infusion INTRAVENOUS 275 mcg/hr (10/06/18 1000)  . midazolam 5 mg/hr (10/06/18 1000)  . norepinephrine (LEVOPHED) Adult infusion Stopped (10/05/18 0419)  . piperacillin-tazobactam (ZOSYN)  IV 12.5 mL/hr at 10/06/18 1000  . propofol (DIPRIVAN) infusion Stopped (10/05/18 0419)   ROS:                                                                                                                                        unobtainable from patient due to mental status  Blood pressure (!) 146/108, pulse 73, temperature (!) 92.5 F (33.6 C), temperature source Bladder, resp. rate (!) 34, height _0  (1.778 m), weight 86.9 kg, SpO2 99 %.  General Examination:                                                                                                      Physical Exam  HEENT-  Normocephalic, no lesions, without obvious abnormality.  Normal external eye and conjunctiva.   Cardiovascular-  pulses palpable throughout   Lungs-intubated Extremities- Warm, dry and intact Musculoskeletal-no joint tenderness, deformity or swelling Skin-warm and dry,   Neurological Examination Patient is intubated and sedated nimbex/ propofol and versed Mental Status: Patient does not respond to verbal stimuli.  Does not respond to deep sternal rub.  Does not follow commands.  No verbalizations noted.  Cranial Nerves: II: patient does not respond confrontation bilaterally,  III,IV,VI:pupils  right 2 mm, left 2 mm,and non-reactive bilaterally, doll's response absent bilaterally.  VIII: patient does not respond to verbal stimuli IX,X: gag reflex absent, XI: trapezius strength unable to test bilaterally XII: tongue strength unable to test Motor: Extremities flaccid throughout.  No spontaneous movement noted.  No purposeful movements noted. Sensory: Does not respond to noxious stimuli in any extremity. Deep Tendon Reflexes:  Absent throughout. Plantars: muted Cerebellar: Unable to perform  Lab Results: Basic Metabolic Panel: Recent Labs  Lab 10/05/18 0328  10/05/18 0601  10/05/18 0736 10/05/18 0810 10/05/18 1203 10/05/18 1410 10/05/18 1925 10/06/18 0137 10/06/18 0457  NA 143   < > 143   < > 146* 141 146* 144 143 142 143  K 2.8*   < > 2.5*   < > 3.1* 2.9* 3.7 4.3 5.4* 5.1 4.5  CL 111   < > 112*   < > 113* 109  --  111 110 113*  --   CO2 20*  --  19*  --   --  17*  --  20* 20* 20*  --   GLUCOSE 92   < > 122*   < > 148* 153*  --  120* 116* 136*  --   BUN 12   < > 12   < > 16 13  --  17 19 21*  --   CREATININE 1.59*   < > 1.30*   < > 1.20 1.37*  --  1.56* 1.34* 1.08  --   CALCIUM 8.9  --  8.8*  --   --  9.0  --  8.8* 8.5* 8.1*  --   MG 1.9  --  2.3  --   --   --   --   --   --   --   --   PHOS 1.5*  --   --   --   --   --   --  3.3  --   --   --    < > = values in this interval not displayed.    CBC: Recent Labs  Lab 10/05/2018 2108  10/05/18 0328  10/05/18 5361 10/05/18 0736 10/05/18 1203 10/06/18 0442 10/06/18 0457  WBC 12.2*  --  12.9*  --   --   --   --  24.7*  --   NEUTROABS 3.5  --   --   --   --   --   --   --   --   HGB 14.9   < > 17.4*   < > 18.0* 18.0* 17.0 16.0 15.3  HCT 44.6   < > 50.7   < > 53.0* 53.0* 50.0 45.6 45.0  MCV 96.7  --  90.1  --   --   --   --  89.1  --   PLT 283  --  295  --   --   --   --  233  --    < > = values in this interval not displayed.    Cardiac Enzymes: Recent Labs  Lab 10/05/18 0341  CKTOTAL 236     Imaging: Ct Head Wo Contrast  Result Date: 10/09/2018 CLINICAL DATA:  28 year old male with altered mental status. EXAM: CT HEAD WITHOUT CONTRAST TECHNIQUE: Contiguous axial images were obtained from the base of the skull through the vertex without intravenous contrast. COMPARISON:  Head CT report dated 12/19/2016 FINDINGS: Brain: No evidence of acute infarction, hemorrhage, hydrocephalus, extra-axial collection or mass lesion/mass effect. Vascular: No hyperdense vessel or unexpected calcification. Skull: Normal. Negative for fracture or focal lesion. Sinuses/Orbits: Mild mucoperiosteal thickening of paranasal sinuses. No air-fluid level. The mastoid air cells are clear. Other: None IMPRESSION: Normal unenhanced CT of the brain. Electronically Signed   By: Anner Crete M.D.   On: 10/12/2018 22:38   Dg Chest 1v Repeat Same Day  Result Date: 09/26/2018 CLINICAL DATA:  Continued low O2 sats EXAM: CHEST - 1 VIEW SAME DAY COMPARISON:  10/02/2018 FINDINGS: Worsening airspace disease throughout both lungs, left greater than right. Heart is normal size. No effusions. Support devices are stable. IMPRESSION: Worsening diffuse bilateral airspace disease, left greater than right. This could reflect infection or edema. Electronically Signed   By:  Rolm Baptise M.D.   On: 10/03/2018 23:53   Dg Chest Port 1 View  Result Date: 10/06/2018 CLINICAL DATA:  Respiratory failure EXAM: PORTABLE CHEST 1 VIEW COMPARISON:  Yesterday FINDINGS: Endotracheal tube tip in good position. Right IJ catheter with tip at the SVC. The orogastric tube reaches the stomach. Improving bilateral airspace disease in this patient with history of ARDS. Normal heart size IMPRESSION: 1. Unremarkable hardware positioning. 2. Improving bilateral airspace disease. Electronically Signed   By: Monte Fantasia M.D.   On: 10/06/2018 08:36   Dg Chest Port 1 View  Result Date: 10/05/2018 CLINICAL DATA:  Central line placement EXAM: PORTABLE CHEST 1  VIEW COMPARISON:  10/12/2018 FINDINGS: Support Apparatus: --Endotracheal tube: Tip at the level of the clavicular heads. --Enteric tube:Tip and sideport project over the stomach. --Catheter(s):Right internal jugular vein approach central venous catheter tip is at the lower SVC --Other: None There is confluent parahilar alveolar opacity, unchanged. No pleural effusion. IMPRESSION: 1. RIJ central venous catheter tip at the lover SVC. 2. Appropriate positioning of endotracheal tube. 3. Unchanged pulmonary edema. Electronically Signed   By: Ulyses Jarred M.D.   On: 10/05/2018 03:40   Dg Chest Portable 1 View  Result Date: 10/06/2018 CLINICAL DATA:  Tube placement EXAM: PORTABLE CHEST 1 VIEW COMPARISON:  None. FINDINGS: The tip of the endotracheal tube is 3 cm above the carina. NG tube is seen coursing below the diaphragm. There is hazy ground-glass airspace opacity seen throughout both lungs. IMPRESSION: Hazy ground glass opacity throughout both lungs could be due to ARDS. Endotracheal tube tip 3 cm above the carina. NG tube below the diaphragm. Electronically Signed   By: Prudencio Pair M.D.   On: 09/30/2018 21:29   Routine EEG 9/10:  This study is suggestive of profound diffuse encephalopathy, likely secondary to diffuse anoxic/hypoxic brain injury.  Frequent bursts of diffuse sharply contoured activity were noted without concomitant clinical signs while patient was on Nimbex.  These discharges are on the ictal-interictal continuum.  LTM EEG 9-11 This study issuggestive of profound diffuse encephalopathy, likely secondary to diffuse anoxic/hypoxic brain injury. Frequent bursts of diffuse sharply contoured activity were noted without concomitant clinical signs while patient was on Nimbex.These discharges are on the ictal-interictal continuum. The EEG appears improved compared to previous study.  Assessment:  Eric Archer is an 28 y.o. male  With tobacco/ ETOH and substance abuse history.  transfer from [facility] for TTM status post witnessed cardiac arrest Initial EEG with frequent bursts of diffuse sharply contoured activity without concomitant clinical signs due to chemical paralysis by Nimbex.  Continued on LTM.  LTM continues to show frequent bursts of diffuse sharply contoured activity without concomitant clinical signs due to chemical paralysis but improved from the day prior. Neurological consultation placed for the abnormal EEG Limited exam because of patient being paralyzed by Nimbex.  Recommendations: -- continue LTM --wean sedation per protocol --Too early to prognosticate --Need exam off of sedation prior to being of much assistance to the primary team. --Supportive care per primary team --Exceptionally early to prognosticate.  Due to TTM, would require exam at least 72 hours after rewarming for any kind of prognostication.  As far as EEG abnormalities, would continue to watch and LTM and will update recommendations -as for right now, no antiepileptics required.  Laurey Morale, MSN, NP-C [facility] [phone]    Attending Neurohospitalist Addendum Patient seen and examined with APP/Resident. Agree with the history and physical as documented above. Agree with the  plan as documented, which I helped formulate. I have independently reviewed the chart, obtained history, review of systems and examined the patient.I have personally reviewed pertinent head/neck/spine imaging (CT/MRI).  CT head on arrival with no acute changes. Please feel free to call with any questions. --- Amie Portland, MD [facility] Pager: [phone]  If 7pm to 7am, please call on call as listed on AMION.

## 2018-10-06 NOTE — Progress Notes (Signed)
RT obtained ABG on pt with the following results. No changes at this time. RT will continue to monitor.   Results for Eric Archer, Eric Archer (MRN [id number]) as of 10/06/2018 05:20  Ref. Range 10/06/2018 04:57  Sample type Unknown ARTERIAL  pH, Arterial Latest Ref Range: 7.350 - 7.450  7.398  pCO2 arterial Latest Ref Range: 32.0 - 48.0 mmHg 34.3  pO2, Arterial Latest Ref Range: 83.0 - 108.0 mmHg 68.0 (L)  TCO2 Latest Ref Range: 22 - 32 mmol/L 23  Acid-base deficit Latest Ref Range: 0.0 - 2.0 mmol/L 3.0 (H)  Bicarbonate Latest Ref Range: 20.0 - 28.0 mmol/L 22.1  O2 Saturation Latest Units: % 96.0  Patient temperature Unknown 33.0 C  Collection site Unknown ARTERIAL LINE

## 2018-10-06 NOTE — Consult Note (Addendum)
Cardiology Consultation:   Patient ID: CHIRAG KRUEGER MRN: [id number]; DOB: Jun 09, 1990  Admit date: 10/21/2018 Date of Consult: 10/06/2018  Primary Care Provider: Patient, No Pcp Per Primary Cardiologist: New to Dr. Percival Spanish    Patient Profile:   Zeus Marquis Duignan is a 28 y.o. male with a hx of hx of alcohol, tobacco and marijuana abuse who is being seen today for the evaluation of cardiac arrest at the request of Dr. Lamonte Sakai.   History of Present Illness:   Mr. Buerkle presented after witness cardiac arrest in front of girlfriend. Normal state of health prior to arrest. No CPR given bystander. EMS found him in asystole. ROSC achieved 15 minutes later for a total time until resuscitation for about 20 minutes.  UDS positive for THC and benzodiazepines. ETOH 216. COVID negative.  Hypothermia protocol initiated. EEG showed profound diffuse encephalopathy--likely secondary to diffuse anoxic/hypoxic brain injury. Seen by neurology. Echo showed severely decreased LVEF at 20-25% and diffuse hypokinesis. RV is poorly visualized but appears to have mildly decreased systolic function.  Scr normal. Elevated LFTs. CXR today showed improved airspace disease. Hs-troponin 89>>699>>778 (yesterday).   Per mom, he was helping his dad last week with heavy exertional loading. He was unusually tired and SOB. It was hard for him to keep up with his dad. No chest pain. No family hx of CAD or premature death.   Heart Pathway Score:     No past medical history on file.  Past Surgical History:  Procedure Laterality Date   SPLENECTOMY      Inpatient Medications: Scheduled Meds:  artificial tears  1 application Both Eyes K4M   chlorhexidine gluconate (MEDLINE KIT)  15 mL Mouth Rinse BID   Chlorhexidine Gluconate Cloth  6 each Topical Daily   cisatracurium  0.1 mg/kg Intravenous Once   folic acid  1 mg Intravenous Daily   heparin  5,000 Units Subcutaneous Q8H   mouth rinse  15 mL  Mouth Rinse 10 times per day   pantoprazole (PROTONIX) IV  40 mg Intravenous QHS   sodium chloride flush  10-40 mL Intracatheter Q12H   thiamine  100 mg Intravenous Daily   Continuous Infusions:  sodium chloride 50 mL/hr at 10/06/18 1100   cisatracurium (NIMBEX) infusion 1 mcg/kg/min (10/06/18 1100)   fentaNYL infusion INTRAVENOUS 275 mcg/hr (10/06/18 1100)   midazolam 5 mg/hr (10/06/18 1100)   norepinephrine (LEVOPHED) Adult infusion Stopped (10/05/18 0419)   piperacillin-tazobactam (ZOSYN)  IV Stopped (10/06/18 1000)   propofol (DIPRIVAN) infusion Stopped (10/05/18 0419)   PRN Meds: cisatracurium **AND** cisatracurium (NIMBEX) infusion **AND** cisatracurium, fentaNYL, hydrALAZINE, midazolam, sodium chloride flush  Allergies:   No Known Allergies  Social History:   Social History   Socioeconomic History   Marital status: Single    Spouse name: Not on file   Number of children: Not on file   Years of education: Not on file   Highest education level: Not on file  Occupational History   Not on file  Social Needs   Financial resource strain: Not on file   Food insecurity    Worry: Not on file    Inability: Not on file   Transportation needs    Medical: Not on file    Non-medical: Not on file  Tobacco Use   Smoking status: Current Every Day Smoker    Packs/day: 0.50    Types: Cigarettes   Smokeless tobacco: Never Used  Substance and Sexual Activity   Alcohol use: Yes    Comment:  weekly   Drug use: Yes    Types: Marijuana    Comment: opiates/benzos   Sexual activity: Yes    Birth control/protection: None  Lifestyle   Physical activity    Days per week: Not on file    Minutes per session: Not on file   Stress: Not on file  Relationships   Social connections    Talks on phone: Not on file    Gets together: Not on file    Attends religious service: Not on file    Active member of club or organization: Not on file    Attends meetings of  clubs or organizations: Not on file    Relationship status: Not on file   Intimate partner violence    Fear of current or ex partner: Not on file    Emotionally abused: Not on file    Physically abused: Not on file    Forced sexual activity: Not on file  Other Topics Concern   Not on file  Social History Narrative   Not on file    Family History:   Mom denied any family hx of CAD  ROS:  Please see the history of present illness.  All other ROS reviewed and negative.     Physical Exam/Data:   Vitals:   10/06/18 0933 10/06/18 1000 10/06/18 1100 10/06/18 1129  BP: (!) 170/105 (!) 146/108 (!) 147/104 (!) 174/93  Pulse:    96  Resp:  (!) 34 (!) 34 (!) 34  Temp:  (!) 92.5 F (33.6 C) (!) 92.5 F (33.6 C)   TempSrc: Bladder Bladder    SpO2:  99% 99% 99%  Weight:      Height:        Intake/Output Summary (Last 24 hours) at 10/06/2018 1144 Last data filed at 10/06/2018 1100 Gross per 24 hour  Intake 1906.26 ml  Output 1519 ml  Net 387.26 ml   Last 3 Weights 10/06/2018 10/05/2018 10/21/2018  Weight (lbs) 191 lb 9.3 oz 190 lb 4.1 oz 200 lb  Weight (kg) 86.9 kg 86.3 kg 90.719 kg     Body mass index is 27.49 kg/m.  General:  Sedated and intubated HEENT:ETT in place Lymph: no adenopathy Neck: no JVD Endocrine:  No thryomegaly Vascular: No carotid bruits; FA pulses 2+ bilaterally without bruits  Cardiac:  normal S1, S2; RRR; no murmur Lungs:  clear to auscultation arterially   Abd: soft, nontender, no hepatomegaly  Ext: cold  Musculoskeletal:  No deformities Skin: no obvious skin lesion  Neuro:  sedated Psych:  Sedated and intuabted  EKG:  The EKG was personally reviewed and demonstrates:   EKG 10/20/2018: sinus tachycardia at rate of 151 bpm, repolarization abnormality  EKG 10/05/2018: SR at rate of 82 bpm, TWI in lateral leads  Telemetry:  Telemetry was personally reviewed and demonstrates:  SR at controlled rate   Relevant CV Studies:  Echo  10/05/2018 IMPRESSIONS    1. The left ventricle has severely reduced systolic function, with an ejection fraction of 20-25%. The cavity size was normal. There is mild concentric left ventricular hypertrophy. Left ventricular diastolic Doppler parameters are consistent with  pseudonormalization. Left ventricular diffuse hypokinesis.  2. The right ventricle has mildly reduced systolic function. The cavity was mildly enlarged. There is no increase in right ventricular wall thickness. Right ventricular systolic pressure could not be assessed.  3. The tricuspid valve is not assessed.  4. The aortic valve was not well visualized. Aortic valve regurgitation was not assessed  by color flow Doppler.  5. The aorta is not well visualized unless otherwise noted.  Laboratory Data:  High Sensitivity Troponin:   Recent Labs  Lab 10/02/2018 2108 10/03/2018 2317 10/05/18 0328 10/05/18 0601  TROPONINIHS 5 89* 699* 778*     Chemistry Recent Labs  Lab 10/05/18 1925 10/06/18 0137 10/06/18 0457 10/06/18 0908  NA 143 142 143 142  K 5.4* 5.1 4.5 4.4  CL 110 113*  --  111  CO2 20* 20*  --  21*  GLUCOSE 116* 136*  --  120*  BUN 19 21*  --  24*  CREATININE 1.34* 1.08  --  1.22  CALCIUM 8.5* 8.1*  --  8.5*  GFRNONAA >60 >60  --  >60  GFRAA >60 >60  --  >60  ANIONGAP 13 9  --  10    Recent Labs  Lab 10/13/2018 2108 10/05/18 0341 10/06/18 0442  PROT 7.0 6.8 7.3  ALBUMIN 4.1 3.9 3.6  AST 139* 380* 111*  ALT 136* 251* 183*  ALKPHOS 82 97 67  BILITOT 0.4 1.1 1.4*   Hematology Recent Labs  Lab 10/09/2018 2108  10/05/18 0328  10/05/18 1203 10/06/18 0442 10/06/18 0457  WBC 12.2*  --  12.9*  --   --  24.7*  --   RBC 4.61  --  5.63  --   --  5.12  --   HGB 14.9   < > 17.4*   < > 17.0 16.0 15.3  HCT 44.6   < > 50.7   < > 50.0 45.6 45.0  MCV 96.7  --  90.1  --   --  89.1  --   MCH 32.3  --  30.9  --   --  31.3  --   MCHC 33.4  --  34.3  --   --  35.1  --   RDW 13.5  --  13.3  --   --  13.5  --    PLT 283  --  295  --   --  233  --    < > = values in this interval not displayed.   BNPNo results for input(s): BNP, PROBNP in the last 168 hours.  DDimer No results for input(s): DDIMER in the last 168 hours.   Radiology/Studies:  Ct Head Wo Contrast  Result Date: 10/06/2018 CLINICAL DATA:  28 year old male with altered mental status. EXAM: CT HEAD WITHOUT CONTRAST TECHNIQUE: Contiguous axial images were obtained from the base of the skull through the vertex without intravenous contrast. COMPARISON:  Head CT report dated 12/19/2016 FINDINGS: Brain: No evidence of acute infarction, hemorrhage, hydrocephalus, extra-axial collection or mass lesion/mass effect. Vascular: No hyperdense vessel or unexpected calcification. Skull: Normal. Negative for fracture or focal lesion. Sinuses/Orbits: Mild mucoperiosteal thickening of paranasal sinuses. No air-fluid level. The mastoid air cells are clear. Other: None IMPRESSION: Normal unenhanced CT of the brain. Electronically Signed   By: Anner Crete M.D.   On: 10/19/2018 22:38   Dg Chest 1v Repeat Same Day  Result Date: 10/22/2018 CLINICAL DATA:  Continued low O2 sats EXAM: CHEST - 1 VIEW SAME DAY COMPARISON:  10/08/2018 FINDINGS: Worsening airspace disease throughout both lungs, left greater than right. Heart is normal size. No effusions. Support devices are stable. IMPRESSION: Worsening diffuse bilateral airspace disease, left greater than right. This could reflect infection or edema. Electronically Signed   By: Rolm Baptise M.D.   On: 10/17/2018 23:53   Dg Chest [facility] 1 View  Result  Date: 10/06/2018 CLINICAL DATA:  Respiratory failure EXAM: PORTABLE CHEST 1 VIEW COMPARISON:  Yesterday FINDINGS: Endotracheal tube tip in good position. Right IJ catheter with tip at the SVC. The orogastric tube reaches the stomach. Improving bilateral airspace disease in this patient with history of ARDS. Normal heart size IMPRESSION: 1. Unremarkable hardware positioning.  2. Improving bilateral airspace disease. Electronically Signed   By: Monte Fantasia M.D.   On: 10/06/2018 08:36   Dg Chest Port 1 View  Result Date: 10/05/2018 CLINICAL DATA:  Central line placement EXAM: PORTABLE CHEST 1 VIEW COMPARISON:  09/30/2018 FINDINGS: Support Apparatus: --Endotracheal tube: Tip at the level of the clavicular heads. --Enteric tube:Tip and sideport project over the stomach. --Catheter(s):Right internal jugular vein approach central venous catheter tip is at the lower SVC --Other: None There is confluent parahilar alveolar opacity, unchanged. No pleural effusion. IMPRESSION: 1. RIJ central venous catheter tip at the lover SVC. 2. Appropriate positioning of endotracheal tube. 3. Unchanged pulmonary edema. Electronically Signed   By: Ulyses Jarred M.D.   On: 10/05/2018 03:40   Dg Chest Portable 1 View  Result Date: 10/08/2018 CLINICAL DATA:  Tube placement EXAM: PORTABLE CHEST 1 VIEW COMPARISON:  None. FINDINGS: The tip of the endotracheal tube is 3 cm above the carina. NG tube is seen coursing below the diaphragm. There is hazy ground-glass airspace opacity seen throughout both lungs. IMPRESSION: Hazy ground glass opacity throughout both lungs could be due to ARDS. Endotracheal tube tip 3 cm above the carina. NG tube below the diaphragm. Electronically Signed   By: Prudencio Pair M.D.   On: 10/06/2018 21:29    Assessment and Plan:   1. Witness cardiac arrest - NO bystander CPR. Resuscitation for > 20 minutes.  UDS positive for THC and benzodiazepines. ETOH 216. COVID negative.  - No family hx of CAD or premature death.  - He had exertional dyspnea and fatigue while helping his dad with heavy lifting.  Hs-troponin 89>>699>>778 (yesterday). EKG with TWI in lateral leads. Echo with severely decreased LVEF at 20-25% and diffuse hypokinesis.  - Will get another troponin level to see trend. He will need ischemic evaluation. Timing per Dr. Percival Spanish, will see later today.   2.  Polysubstance abuse - On Thiamin and folic acid  3. Elevated Blood pressure - On supplement  4. Transaminates - Likely shocky liver  5. ARDS - Per PCCM   For questions or updates, please contact [place] Please consult [email or web address] for contact info under    Jarrett Soho, PA  10/06/2018 11:44 AM    History and all data above reviewed.  Patient examined.   I agree with the findings as above.  This is an unfortunate situation of a gentleman who is never had prior cardiac history.  He has had some recent shortness of breath his mom reports with heavy work but it does not sound like he has been having any noticed significant edema, PND or orthopnea.  He was not complaining to her about palpitations or chest pain.  He does work with his father which is somewhat busy but not overly exerting.  He might play a some sports with his 59-year-old but he is not a particularly active gentleman.  He does use marijuana and apparently drinks alcohol daily per his mother's report but she thinks he minimizes this.  He had a witnessed cardiac arrest it sounds sudden with no prodrome.  The initial rhythm was apparently asystole he received CPR as above.  He had  been found to have newly diagnosed dilated cardiomyopathy.  I reviewed the images.  It somewhat difficult to assess the RV.  However, he is not hemodynamically compromised.  His initial EKG did not demonstrate QT prolongation or acute ST segment changes.  He is now intubated and cooled.  He sedated and paralyzed.  His rhythm shows sinus with occasional ventricular ectopy.   He has worsening airspace disease with increased ventilatory support O2 requirements.  He has been febrile.  He is being empirically treated with antibiotics.  Patient exam reveals COR:RRR, no murmur  ,  Lungs: Decreased breath sounds bilaterally.,  Abd: Decreased bowel sounds, Ext No edema  .  All available labs, radiology testing, previous records reviewed. Agree with  documented assessment and plan.   CARDIAC ARREST: Initial rhythm with apparent asystole.  This may well of been a primary arrhythmic event.  He has a cardiomyopathy and is unclear whether this is a result of his prolonged arrest or the primary issue.  For now he needs continued supportive care.  I do not suspect endocarditis.  There is no history of IV drug use and he has no stigmata consistent with this.  I do think we will await neurologic recovery and he will likely need right and left heart cath and possibly EP evaluation pending this outcome.  I do not strongly suspect obstructive coronary disease but this and coronary anomalies could be excluded with left heart cath.  Cardiac enzymes are not suggestive of an acute cardiac process.    Charlynn Salih  2:00 PM  10/06/2018

## 2018-10-06 NOTE — Plan of Care (Signed)
Plan of care reviewed. Patient intubated and sedated. Education delayed.

## 2018-10-06 NOTE — Progress Notes (Signed)
A Line reading not correlating with BP cuff, CCM made aware. Hydralazine administration based on cuff reading.

## 2018-10-07 DIAGNOSIS — I420 Dilated cardiomyopathy: Secondary | ICD-10-CM

## 2018-10-07 DIAGNOSIS — R9401 Abnormal electroencephalogram [EEG]: Secondary | ICD-10-CM

## 2018-10-07 LAB — POCT I-STAT, CHEM 8
BUN: 23 mg/dL — ABNORMAL HIGH (ref 6–20)
Calcium, Ion: 1.13 mmol/L — ABNORMAL LOW (ref 1.15–1.40)
Chloride: 111 mmol/L (ref 98–111)
Creatinine, Ser: 1.3 mg/dL — ABNORMAL HIGH (ref 0.61–1.24)
Glucose, Bld: 96 mg/dL (ref 70–99)
HCT: 39 % (ref 39.0–52.0)
Hemoglobin: 13.3 g/dL (ref 13.0–17.0)
Potassium: 4.4 mmol/L (ref 3.5–5.1)
Sodium: 145 mmol/L (ref 135–145)
TCO2: 26 mmol/L (ref 22–32)

## 2018-10-07 LAB — CBC
HCT: 40.3 % (ref 39.0–52.0)
Hemoglobin: 13.6 g/dL (ref 13.0–17.0)
MCH: 30.8 pg (ref 26.0–34.0)
MCHC: 33.7 g/dL (ref 30.0–36.0)
MCV: 91.4 fL (ref 80.0–100.0)
Platelets: 219 10*3/uL (ref 150–400)
RBC: 4.41 MIL/uL (ref 4.22–5.81)
RDW: 14.5 % (ref 11.5–15.5)
WBC: 30.5 10*3/uL — ABNORMAL HIGH (ref 4.0–10.5)
nRBC: 0 % (ref 0.0–0.2)

## 2018-10-07 LAB — GLUCOSE, CAPILLARY
Glucose-Capillary: 64 mg/dL — ABNORMAL LOW (ref 70–99)
Glucose-Capillary: 74 mg/dL (ref 70–99)
Glucose-Capillary: 92 mg/dL (ref 70–99)
Glucose-Capillary: 93 mg/dL (ref 70–99)
Glucose-Capillary: 97 mg/dL (ref 70–99)

## 2018-10-07 LAB — BASIC METABOLIC PANEL
Anion gap: 10 (ref 5–15)
BUN: 19 mg/dL (ref 6–20)
CO2: 22 mmol/L (ref 22–32)
Calcium: 8.2 mg/dL — ABNORMAL LOW (ref 8.9–10.3)
Chloride: 113 mmol/L — ABNORMAL HIGH (ref 98–111)
Creatinine, Ser: 1.3 mg/dL — ABNORMAL HIGH (ref 0.61–1.24)
GFR calc Af Amer: >60 mL/min (ref >60)
GFR calc non Af Amer: >60 mL/min (ref >60)
Glucose, Bld: 100 mg/dL — ABNORMAL HIGH (ref 70–99)
Potassium: 4 mmol/L (ref 3.5–5.1)
Sodium: 145 mmol/L (ref 135–145)

## 2018-10-07 MED ORDER — MIDAZOLAM HCL 2 MG/2ML IJ SOLN
2.0000 mg | INTRAMUSCULAR | Status: DC | PRN
  Administered 2018-10-08 – 2018-10-11 (×4): 2 mg via INTRAVENOUS
  Filled 2018-10-07 (×4): qty 2

## 2018-10-07 MED ORDER — MIDAZOLAM HCL 2 MG/2ML IJ SOLN
2.0000 mg | INTRAMUSCULAR | Status: AC | PRN
  Administered 2018-10-07 – 2018-10-08 (×3): 2 mg via INTRAVENOUS
  Filled 2018-10-07 (×3): qty 2

## 2018-10-07 MED ORDER — FUROSEMIDE 10 MG/ML IJ SOLN
40.0000 mg | Freq: Once | INTRAMUSCULAR | Status: AC
  Administered 2018-10-07: 40 mg via INTRAVENOUS
  Filled 2018-10-07: qty 4

## 2018-10-07 MED ORDER — FENTANYL CITRATE (PF) 100 MCG/2ML IJ SOLN
50.0000 ug | INTRAMUSCULAR | Status: AC | PRN
  Administered 2018-10-07 – 2018-10-08 (×3): 50 ug via INTRAVENOUS
  Filled 2018-10-07 (×3): qty 2

## 2018-10-07 MED ORDER — LACTATED RINGERS IV SOLN
INTRAVENOUS | Status: DC
  Administered 2018-10-07 – 2018-10-12 (×3): via INTRAVENOUS

## 2018-10-07 MED ORDER — DEXTROSE 50 % IV SOLN
12.5000 g | INTRAVENOUS | Status: AC
  Administered 2018-10-07: 13:00:00 12.5 g via INTRAVENOUS

## 2018-10-07 MED ORDER — FENTANYL CITRATE (PF) 100 MCG/2ML IJ SOLN
50.0000 ug | INTRAMUSCULAR | Status: DC | PRN
  Administered 2018-10-07: 200 ug via INTRAVENOUS
  Administered 2018-10-07: 100 ug via INTRAVENOUS
  Administered 2018-10-07: 200 ug via INTRAVENOUS
  Administered 2018-10-07: 100 ug via INTRAVENOUS
  Administered 2018-10-08 (×6): 200 ug via INTRAVENOUS
  Administered 2018-10-11: 100 ug via INTRAVENOUS
  Filled 2018-10-07 (×3): qty 4
  Filled 2018-10-07: qty 2
  Filled 2018-10-07: qty 4
  Filled 2018-10-07: qty 2
  Filled 2018-10-07: qty 4
  Filled 2018-10-07: qty 2
  Filled 2018-10-07: qty 4
  Filled 2018-10-07: qty 2
  Filled 2018-10-07 (×2): qty 4

## 2018-10-07 NOTE — Progress Notes (Signed)
Progress Note  Patient Name: Eric Archer Date of Encounter: 10/07/2018  Primary Cardiologist: No primary care provider on file. New [place]  Subjective   Intubated,unresponsive   Inpatient Medications    Scheduled Meds: . chlorhexidine gluconate (MEDLINE KIT)  15 mL Mouth Rinse BID  . Chlorhexidine Gluconate Cloth  6 each Topical Daily  . folic acid  1 mg Intravenous Daily  . heparin  5,000 Units Subcutaneous Q8H  . mouth rinse  15 mL Mouth Rinse 10 times per day  . pantoprazole (PROTONIX) IV  40 mg Intravenous QHS  . sodium chloride flush  10-40 mL Intracatheter Q12H  . thiamine  100 mg Intravenous Daily   Continuous Infusions: . fentaNYL infusion INTRAVENOUS Stopped (10/07/18 0249)  . lactated ringers 100 mL/hr at 10/07/18 1115  . midazolam Stopped (10/07/18 0248)  . norepinephrine (LEVOPHED) Adult infusion Stopped (10/05/18 0419)  . piperacillin-tazobactam (ZOSYN)  IV 3.375 g (10/07/18 4166)  . propofol (DIPRIVAN) infusion Stopped (10/05/18 0419)   PRN Meds: fentaNYL, hydrALAZINE, midazolam, sodium chloride flush   Vital Signs    Vitals:   10/07/18 0832 10/07/18 0900 10/07/18 1000 10/07/18 1100  BP: (!) 141/78 123/81 (!) 125/91 125/89  Pulse: (!) 114     Resp:  (!) 34 (!) 34 (!) 34  Temp:      TempSrc:      SpO2:  100% 100% 100%  Weight:      Height:        Intake/Output Summary (Last 24 hours) at 10/07/2018 1129 Last data filed at 10/07/2018 1000 Gross per 24 hour  Intake 2118.28 ml  Output 1125 ml  Net 993.28 ml   Last 3 Weights 10/06/2018 10/05/2018 10/10/2018  Weight (lbs) 191 lb 9.3 oz 190 lb 4.1 oz 200 lb  Weight (kg) 86.9 kg 86.3 kg 90.719 kg      Telemetry    NSR - Personally Reviewed  ECG    None today - Personally Reviewed  Physical Exam   GEN: Intubated. unresponsive   Neck: No JVD Cardiac: RRR, no murmurs, rubs, or gallops.  Respiratory: Clear to auscultation bilaterally. GI: Soft, nontender, non-distended  MS: No  edema; No deformity. Neuro:  intubated and unresponsive  Labs    High Sensitivity Troponin:   Recent Labs  Lab 09/26/2018 2108 09/30/2018 2317 10/05/18 0328 10/05/18 0601 10/06/18 1324  TROPONINIHS 5 89* 699* 778* 144*      Chemistry Recent Labs  Lab 10/11/2018 2108  10/05/18 0341  10/06/18 0137 10/06/18 0442  10/06/18 0908  10/06/18 2223 10/07/18 0034 10/07/18 0538  NA 138   < >  --    < > 142  --    < > 142   < > 147* 145 145  K 2.4*   < >  --    < > 5.1  --    < > 4.4   < > 3.7 4.4 4.0  CL 100   < >  --    < > 113*  --   --  111   < > 113* 111 113*  CO2 19*   < >  --    < > 20*  --   --  21*  --   --   --  22  GLUCOSE 321*   < >  --    < > 136*  --   --  120*   < > 91 96 100*  BUN 10   < >  --    < >  21*  --   --  24*   < > 20 23* 19  CREATININE 1.32*   < >  --    < > 1.08  --   --  1.22   < > 0.90 1.30* 1.30*  CALCIUM 9.1   < >  --    < > 8.1*  --   --  8.5*  --   --   --  8.2*  PROT 7.0  --  6.8  --   --  7.3  --   --   --   --   --   --   ALBUMIN 4.1  --  3.9  --   --  3.6  --   --   --   --   --   --   AST 139*  --  380*  --   --  111*  --   --   --   --   --   --   ALT 136*  --  251*  --   --  183*  --   --   --   --   --   --   ALKPHOS 82  --  97  --   --  67  --   --   --   --   --   --   BILITOT 0.4  --  1.1  --   --  1.4*  --   --   --   --   --   --   GFRNONAA >60   < >  --    < > >60  --   --  >60  --   --   --  >60  GFRAA >60   < >  --    < > >60  --   --  >60  --   --   --  >60  ANIONGAP 19*   < >  --    < > 9  --   --  10  --   --   --  10   < > = values in this interval not displayed.     Hematology Recent Labs  Lab 10/05/18 0328  10/06/18 0442  10/06/18 2223 10/07/18 0034 10/07/18 0538  WBC 12.9*  --  24.7*  --   --   --  30.5*  RBC 5.63  --  5.12  --   --   --  4.41  HGB 17.4*   < > 16.0   < > 13.6 13.3 13.6  HCT 50.7   < > 45.6   < > 40.0 39.0 40.3  MCV 90.1  --  89.1  --   --   --  91.4  MCH 30.9  --  31.3  --   --   --  30.8  MCHC 34.3  --   35.1  --   --   --  33.7  RDW 13.3  --  13.5  --   --   --  14.5  PLT 295  --  233  --   --   --  219   < > = values in this interval not displayed.    BNPNo results for input(s): BNP, PROBNP in the last 168 hours.   DDimer No results for input(s): DDIMER in the last 168 hours.   Radiology    Dg Abd 1 View  Result Date: 10/06/2018 CLINICAL DATA:  Hyperbilirubinemia EXAM: ABDOMEN - 1 VIEW COMPARISON:  None. FINDINGS: Scattered large and small bowel gas is noted. Gastric catheter is noted within the stomach. No bony abnormality is seen. No mass lesion is noted. IMPRESSION: No acute abnormality noted. Electronically Signed   By: [name] M.D.   On: 10/06/2018 14:22   Dg Chest Port 1 View  Result Date: 10/06/2018 CLINICAL DATA:  Respiratory failure EXAM: PORTABLE CHEST 1 VIEW COMPARISON:  Yesterday FINDINGS: Endotracheal tube tip in good position. Right IJ catheter with tip at the SVC. The orogastric tube reaches the stomach. Improving bilateral airspace disease in this patient with history of ARDS. Normal heart size IMPRESSION: 1. Unremarkable hardware positioning. 2. Improving bilateral airspace disease. Electronically Signed   By: Monte Fantasia M.D.   On: 10/06/2018 08:36    Cardiac Studies   Echo IMPRESSIONS    1. The left ventricle has severely reduced systolic function, with an ejection fraction of 20-25%. The cavity size was normal. There is mild concentric left ventricular hypertrophy. Left ventricular diastolic Doppler parameters are consistent with  pseudonormalization. Left ventricular diffuse hypokinesis.  2. The right ventricle has mildly reduced systolic function. The cavity was mildly enlarged. There is no increase in right ventricular wall thickness. Right ventricular systolic pressure could not be assessed.  3. The tricuspid valve is not assessed.  4. The aortic valve was not well visualized. Aortic valve regurgitation was not assessed by color flow Doppler.  5.  The aorta is not well visualized unless otherwise noted.  Patient Profile     28 y.o. male with polysubstance abuse admitted following out of hospital cardiac arrest- asystole  Assessment & Plan    1. Asystolic cardiac arrest. NO bystander CPR. Resuscitation for > 20 minutes. UDS positive for THC and benzodiazepines. ETOH 216. COVID negative.  - No family hx of CAD or premature death.  - He had exertional dyspnea and fatigue while helping his dad with heavy lifting.  Hs-troponin 89>>699>>778 (yesterday). EKG with TWI in lateral leads. Echo with severely decreased LVEF at 20-25% and diffuse hypokinesis. Will need right and left heart cath if Neurologic function recovers - no arrhythmias seen since admission.  2. Cardiomyopathy. ? Primary cardiomyopathy or reduced EF post resuscitation.        For questions or updates, please contact [place] Please consult [email or web address] for contact info under        Signed,  [place], MD  10/07/2018, 11:29 AM

## 2018-10-07 NOTE — Progress Notes (Signed)
No urine output noted in CDU on shift assessment. Bladder scanner showed volume >590. Reported finding to Dr. Lamonte Sakai. Dr. Lamonte Sakai did not want to intermittent cath; ordered Foley.

## 2018-10-07 NOTE — Progress Notes (Signed)
Fentanyl 276ml, midozolam 67ml, Nimbex 144ml IV wasted in Stericycle.  Witnessed by Inis Sizer, RN

## 2018-10-07 NOTE — Progress Notes (Signed)
LTM EEG reviewed till  2010 on 10/06/2018. Appears imporved compared to prior study with more continuous 2-5hz  delta-theta slowing. Will review the rest of the study once uploaded and update the team.

## 2018-10-07 NOTE — Progress Notes (Signed)
eLink Physician-Brief Progress Note Patient Name: NEEDHAM BIGGINS DOB: 1990/03/19 MRN: [id number]   Date of Service  10/07/2018  HPI/Events of Note  Notified of oliguria with urine output only 67ml for the past 2 hours.  Pt is on hypothermia protocol.   eICU Interventions  Increase IVFs to 100cc/hr.     Intervention Category Minor Interventions: Other:  Elsie Lincoln 10/07/2018, 1:06 AM

## 2018-10-07 NOTE — Progress Notes (Signed)
NAME:  Eric Archer, MRN:  [id number], DOB:  25-Jul-1990, LOS: 3 ADMISSION DATE:  10/24/2018, CONSULTATION DATE:  10/10/2018 REFERRING MD:  Estell HarpinZammit, CHIEF COMPLAINT:  Cardiac arrest  Brief History   28 year old male with history of substance abuse presented to witnessed cardiac arrest--no bystander CPR performed.  EMS arrived and 5 to 8 minutes where patient was found to be in asystole and CPR was initiated. ROSC achieved 15 minutes later for a total time until resuscitation of 20 to 23 minutes.  History of present illness   This is a 28 year old male with past medical history significant for substance abuse presents the ED on 10/03/2018 via EMS status post cardiac arrest.  Cardiac arrest was witnessed by patient's girlfriend.  No bystander CPR was initiated at that time.  Is noted that took EMS about 5 to 8 minutes to arrive at the scene followed by about 15 minutes of CPR after which time ROSC was obtained.  During that time 1 mg Narcan and 1 mg epi.  Patient did not resume spontaneous breathing. At time of presentation is unclear which substances or abuse if any.  Father does note history of prescription drug abuse however he is unsure if patient is currently using any other drugs than marijuana. 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 Labs: Initial lactic acid 8.2.  UDS positive for THC and benzodiazepines.  EtOH 216.  COVID negative.  Initial chest x-ray showed diffuse GGO throughout.  Due to patient's ongoing hypoxia and increasing dilatory requirements, repeat chest x-ray was performed showing worsening airspace disease.  Patient was also noted to have a temperature of 102.4 F.  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  09/26/2018: CT head without contrast: unremarkable  10/05/18 EEG: profound diffuse encephalopathy--likely secondary to diffuse anoxic/hypoxic brain injury    Micro Data:  10/25/2018 blood culture: no growth  Antimicrobials:  Zosyn 9/9 >> vanco 9/9 >>9/10  Interim history/subjective:  Now at goal temperature 37 Foley removed, then urine obtained on I/O.  LR started at 100 cc/h EEG with diffuse slowing as below   Objective   Blood pressure 125/89, pulse (!) 114, temperature 99 F (37.2 C), temperature source Esophageal, resp. rate (!) 34, height 5\' 10"  (1.778 m), weight 86.9 kg, SpO2 100 %. CVP:  [5 mmHg-12 mmHg] 5 mmHg  Vent Mode: PRVC FiO2 (%):  [40 %] 40 % Set Rate:  [34 bmp] 34 bmp Vt Set:  [440 mL] 440 mL PEEP:  [8 cmH20-10 cmH20] 8 cmH20 Plateau Pressure:  [22 cmH20-28 cmH20] 22 cmH20   Intake/Output Summary (Last 24 hours) at 10/07/2018 1136 Last data filed at 10/07/2018 1000 Gross per 24 hour  Intake 2118.28 ml  Output 1125 ml  Net 993.28 ml   Filed Weights   09/27/2018 2256 10/05/18 0700 10/06/18 0600  Weight: 90.7 kg 86.3 kg 86.9 kg    Examination: General: Intubated ill-appearing man HENT: ET tube and OG tube in place Lungs: Coarse bilateral breath sounds, no wheezes Cardiovascular: Regular, no edema, no murmur Abdomen: Soft, nondistended, some bilious OG tube suctioning Neuro: Off continuous  sedation.  Partially opens eyes, has an upward deviated gaze.  Is not moving extremities, does not respond to pain or voice.  Pupils react to light.  No cough Skin: No rash  Resolved Hospital Problem list   Acute renal injury.  AGMA  Assessment & Plan:  This is a 28 year old male status post witnessed cardiac arrest.  No CPR for about 5 to 8 minutes until EMS arrived.  Return  of spontaneous circulation after approximately 15 minutes and 1 mg epi 1 mg Narcan.  Respiratory distress with chest x-ray consistent with edema.  Patient placed on cooling protocol.  Status post cardiac arrest of unknown etiology. Dysrhythmia vs toxin induced vs heart failure vs  Infection. No apparent dysrhythmias on EKG. UDS + for benzo and THC. 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 Left ventricular diffuse hypokinesis.  Ischemic injury vs infectious (myocarditis/pericarditis) vs chronic. Hypertension. Bps 160s-200s Plan:  -Status post TTM, currently 27 -Appreciate cardiology assistance.  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  He will likely need EP evaluation and catheterization if he recovers from the acute arrest  ARDS/Acute respiratory failure with hypoxia and hypercarbia.  CXR and FiO2/PaO2 86 consistent with severe ARDS.  White count 12-->25. Procalc 40-->80. Was noted to have a Tmax of 102 on admission. COVID neg. No growth on BC at this time. Lipase normal ABG this morning: Arterial Blood Gas result:  pO2 68; pCO2 23; pH 7.39;  HCO3 22, %O2 Sat 96.  CXR this morning indicitive of improvement in bilateral opacities Plan:   -Continue ARDS later strategy -Continue Zosyn empiric for possible aspiration pneumonia -VAP prevention order set -Consider empiric diuresis, goal CVP 5-7  Acute Encephalopathy in setting of cardiac arrest with unknown down time. UDS + for THC and  EEG consistent with anoxic brain injury.  CT head on admission neg Plan -Appreciate neurology assistance, continuous EEG planned for 9/12 -Need to assess neurological status and prognosis 72 hours after rewarming  Hyperkalemia, resolved  Substance abuse.  Alcohol abuse Plan -Thiamine, folate  Transaminitis--likely liver shock.  -Follow LFT, hepatitis panel negative     Best practice:  Diet: npo Pain/Anxiety/Delirium protocol (if  indicated): intermittent fent, versed VAP protocol (if indicated): Head of bed up 30 degrees DVT prophylaxis: Heparin 5000 units every 8 hours GI prophylaxis: Protonix Mobility: Bedrest Code Status: Full Family Communication: Discussed with patient's mother at bedside on 9/12 Disposition: ICU  Independent critical care time 34 minutes   Baltazar Apo, MD, PhD 10/07/2018, 11:48 AM [facility] [phone] or if no answer [phone]

## 2018-10-07 NOTE — Progress Notes (Signed)
LTM maintenance completed; no skin breakdown was seen, checked A1 and reprepped. Added paste to C3. Also moved study as was sitting local.

## 2018-10-07 NOTE — Progress Notes (Addendum)
NEURO HOSPITALIST PROGRESS NOTE   Subjective: Patient in bed intubated. Reached 37 @ 0045 10/07/2018. Off all sedation at time of exam. Will wait full 72 hours post- rewarming for a good neurological exam. LTM continues running.   Exam: Vitals:   10/07/18 0832 10/07/18 0900  BP: (!) 141/78 123/81  Pulse: (!) 114   Resp:  (!) 34  Temp:    SpO2:  100%     Physical Exam  HEENT-  Normocephalic, no lesions, without obvious abnormality.  Normal external eye and conjunctiva.   Cardiovascular-  pulses palpable throughout   Lungs-intubated Extremities- Warm, dry and intact Musculoskeletal-no joint tenderness, deformity or swelling Skin-warm and dry,    Neuro: patient off all sedation since 0250/ rewarmed at 0045 10/07/2018 Mental Status: Patient does not respond to verbal stimuli.    Does not follow commands.  No verbalizations noted. Patient opens eyes spontaneously and to noxious. Cranial Nerves: II: patient does not respond confrontation bilaterally,  III,IV,VI:pupils right 2 mm, left 2 mm,and reactive bilaterally, doll's response absent bilaterally.  VIII: patient does not respond to verbal stimuli IX,X: gag reflex absent, XI: trapezius strength unable to test bilaterally XII: tongue strength unable to test Motor: Extremities flaccid throughout.  No spontaneous movement noted.  No purposeful movements noted. Sensory: Does not respond to noxious stimuli in any extremity. Opens eyes to noxious. Deep Tendon Reflexes:  Absent throughout. Plantars: muted Cerebellar: Unable to perform  Medications:  Scheduled: . chlorhexidine gluconate (MEDLINE KIT)  15 mL Mouth Rinse BID  . Chlorhexidine Gluconate Cloth  6 each Topical Daily  . folic acid  1 mg Intravenous Daily  . heparin  5,000 Units Subcutaneous Q8H  . mouth rinse  15 mL Mouth Rinse 10 times per day  . pantoprazole (PROTONIX) IV  40 mg Intravenous QHS  . sodium chloride flush  10-40 mL Intracatheter  Q12H  . thiamine  100 mg Intravenous Daily   Continuous: . fentaNYL infusion INTRAVENOUS Stopped (10/07/18 0249)  . lactated ringers 100 mL/hr at 10/07/18 0300  . midazolam Stopped (10/07/18 0248)  . norepinephrine (LEVOPHED) Adult infusion Stopped (10/05/18 0419)  . piperacillin-tazobactam (ZOSYN)  IV 3.375 g (10/07/18 2952)  . propofol (DIPRIVAN) infusion Stopped (10/05/18 0419)   WUX:LKGMWNUU, hydrALAZINE, midazolam, sodium chloride flush  Pertinent Labs/Diagnostics:   Dg Abd 1 View  Result Date: 10/06/2018 CLINICAL DATA:  Hyperbilirubinemia EXAM: ABDOMEN - 1 VIEW COMPARISON:  None. FINDINGS: Scattered large and small bowel gas is noted. Gastric catheter is noted within the stomach. No bony abnormality is seen. No mass lesion is noted. IMPRESSION: No acute abnormality noted. Electronically Signed   By: [name] M.D.   On: 10/06/2018 14:22   Dg Chest Port 1 View  Result Date: 10/06/2018 CLINICAL DATA:  Respiratory failure EXAM: PORTABLE CHEST 1 VIEW COMPARISON:  Yesterday FINDINGS: Endotracheal tube tip in good position. Right IJ catheter with tip at the SVC. The orogastric tube reaches the stomach. Improving bilateral airspace disease in this patient with history of ARDS. Normal heart size IMPRESSION: 1. Unremarkable hardware positioning. 2. Improving bilateral airspace disease. Electronically Signed   By: Monte Fantasia M.D.   On: 10/06/2018 08:36    Routine EEG 9/10:  This study issuggestive of profound diffuse encephalopathy, likely secondary to diffuse anoxic/hypoxic brain injury. Frequent bursts of diffuse sharply contoured activity were noted without concomitant clinical signs while patient was  on Nimbex.These discharges are on the ictal-interictal continuum.  LTM EEG 9-11 This study issuggestive of profound diffuse encephalopathy, likely secondary to diffuse anoxic/hypoxic brain injury. Frequent bursts of diffuse sharply contoured activity were noted without  concomitant clinical signs while patient was on Nimbex.These discharges are on the ictal-interictal continuum. The EEG appears improved compared to previous study.  LTM EEG 10/07/2018: LTM EEG reviewed till  2010 on 10/06/2018. Appears imporved compared to prior study with more continuous 2-_0  delta-theta slowing. Will review the rest of the study once uploaded and update the team.  Assessment:  Eric Archer is an 28 y.o. male  With tobacco/ ETOH and substance abuse history. transfer from [facility] for TTM status post witnessed cardiac arrest Initial EEG and LTM EEG yesterday showed frequent bursts of diffusely sharply contoured activity without concomitant signs due to being on Nimbex but after rewarming and discontinuation of Nimbex, EEG has showed some improvement with 2 to 5 Hz delta theta slowing.  Formal read pending.  Recommendations: -- continue LTM --Too early to prognosticate-will need formal exam 72 hours after rewarming. --Supportive care per primary team --No antiepileptics.  EEG improved from before.  I would expect this to continue to improve.   Laurey Morale, MSN, NP-C [facility] [phone]  Attending neurologist's note to follow  Attending Neurohospitalist Addendum Patient seen and examined with APP/Resident. Agree with the history and physical as documented above. Agree with the plan as documented, which I helped formulate. I have independently reviewed the chart, obtained history, review of systems and examined the patient.I have personally reviewed pertinent head/neck/spine imaging (CT/MRI). Please feel free to call with any questions. --- Amie Portland, MD [facility] Pager: [phone]  If 7pm to 7am, please call on call as listed on AMION.  CRITICAL CARE ATTESTATION Performed by: Amie Portland, MD Total critical care time: 30 minutes Critical care time was exclusive of separately billable procedures and treating other  patients and/or supervising APPs/Residents/Students Critical care was necessary to treat or prevent imminent or life-threatening deterioration due to hypoxic ischemic encephalopathy This patient is critically ill and at significant risk for neurological worsening and/or death and care requires constant monitoring. Critical care was time spent personally by me on the following activities: development of treatment plan with patient and/or surrogate as well as nursing, discussions with consultants, evaluation of patient's response to treatment, examination of patient, obtaining history from patient or surrogate, ordering and performing treatments and interventions, ordering and review of laboratory studies, ordering and review of radiographic studies, pulse oximetry, re-evaluation of patient's condition, participation in multidisciplinary rounds and medical decision making of high complexity in the care of this patient.   10/07/2018, 9:56 AM

## 2018-10-08 ENCOUNTER — Inpatient Hospital Stay (HOSPITAL_COMMUNITY): Payer: Medicaid Other

## 2018-10-08 LAB — POCT I-STAT 7, (LYTES, BLD GAS, ICA,H+H)
Acid-Base Excess: 4 mmol/L — ABNORMAL HIGH (ref 0.0–2.0)
Acid-base deficit: 1 mmol/L (ref 0.0–2.0)
Bicarbonate: 28.8 mmol/L — ABNORMAL HIGH (ref 20.0–28.0)
Bicarbonate: 21.9 mmol/L (ref 20.0–28.0)
Calcium, Ion: 1.22 mmol/L (ref 1.15–1.40)
Calcium, Ion: 1.29 mmol/L (ref 1.15–1.40)
HCT: 38 % — ABNORMAL LOW (ref 39.0–52.0)
HCT: 38 % — ABNORMAL LOW (ref 39.0–52.0)
Hemoglobin: 12.9 g/dL — ABNORMAL LOW (ref 13.0–17.0)
Hemoglobin: 12.9 g/dL — ABNORMAL LOW (ref 13.0–17.0)
O2 Saturation: 98 %
O2 Saturation: 94 %
Patient temperature: 37
Patient temperature: 37
Potassium: 3.8 mmol/L (ref 3.5–5.1)
Potassium: 3.4 mmol/L — ABNORMAL LOW (ref 3.5–5.1)
Sodium: 147 mmol/L — ABNORMAL HIGH (ref 135–145)
Sodium: 149 mmol/L — ABNORMAL HIGH (ref 135–145)
TCO2: 30 mmol/L (ref 22–32)
TCO2: 23 mmol/L (ref 22–32)
pCO2 arterial: 43.6 mmHg (ref 32.0–48.0)
pCO2 arterial: 31.6 mmHg — ABNORMAL LOW (ref 32.0–48.0)
pH, Arterial: 7.428 (ref 7.350–7.450)
pH, Arterial: 7.449 (ref 7.350–7.450)
pO2, Arterial: 104 mmHg (ref 83.0–108.0)
pO2, Arterial: 67 mmHg — ABNORMAL LOW (ref 83.0–108.0)

## 2018-10-08 LAB — TRIGLYCERIDES: Triglycerides: 425 mg/dL — ABNORMAL HIGH (ref <150)

## 2018-10-08 LAB — BASIC METABOLIC PANEL
Anion gap: 12 (ref 5–15)
BUN: 21 mg/dL — ABNORMAL HIGH (ref 6–20)
CO2: 25 mmol/L (ref 22–32)
Calcium: 9.1 mg/dL (ref 8.9–10.3)
Chloride: 110 mmol/L (ref 98–111)
Creatinine, Ser: 1.39 mg/dL — ABNORMAL HIGH (ref 0.61–1.24)
GFR calc Af Amer: >60 mL/min (ref >60)
GFR calc non Af Amer: >60 mL/min (ref >60)
Glucose, Bld: 95 mg/dL (ref 70–99)
Potassium: 3.8 mmol/L (ref 3.5–5.1)
Sodium: 147 mmol/L — ABNORMAL HIGH (ref 135–145)

## 2018-10-08 LAB — HEPATIC FUNCTION PANEL
ALT: 86 U/L — ABNORMAL HIGH (ref 0–44)
AST: 100 U/L — ABNORMAL HIGH (ref 15–41)
Albumin: 3.3 g/dL — ABNORMAL LOW (ref 3.5–5.0)
Alkaline Phosphatase: 87 U/L (ref 38–126)
Bilirubin, Direct: 0.2 mg/dL (ref 0.0–0.2)
Indirect Bilirubin: 1 mg/dL — ABNORMAL HIGH (ref 0.3–0.9)
Total Bilirubin: 1.2 mg/dL (ref 0.3–1.2)
Total Protein: 7.2 g/dL (ref 6.5–8.1)

## 2018-10-08 LAB — MAGNESIUM: Magnesium: 2.4 mg/dL (ref 1.7–2.4)

## 2018-10-08 LAB — GLUCOSE, CAPILLARY
Glucose-Capillary: 90 mg/dL (ref 70–99)
Glucose-Capillary: 105 mg/dL — ABNORMAL HIGH (ref 70–99)
Glucose-Capillary: 109 mg/dL — ABNORMAL HIGH (ref 70–99)
Glucose-Capillary: 128 mg/dL — ABNORMAL HIGH (ref 70–99)
Glucose-Capillary: 100 mg/dL — ABNORMAL HIGH (ref 70–99)
Glucose-Capillary: 88 mg/dL (ref 70–99)
Glucose-Capillary: 93 mg/dL (ref 70–99)
Glucose-Capillary: 97 mg/dL (ref 70–99)

## 2018-10-08 LAB — CBC
HCT: 40.5 % (ref 39.0–52.0)
Hemoglobin: 13.7 g/dL (ref 13.0–17.0)
MCH: 31.4 pg (ref 26.0–34.0)
MCHC: 33.8 g/dL (ref 30.0–36.0)
MCV: 92.9 fL (ref 80.0–100.0)
Platelets: 233 10*3/uL (ref 150–400)
RBC: 4.36 MIL/uL (ref 4.22–5.81)
RDW: 15.2 % (ref 11.5–15.5)
WBC: 30.5 10*3/uL — ABNORMAL HIGH (ref 4.0–10.5)
nRBC: 0.2 % (ref 0.0–0.2)

## 2018-10-08 MED ORDER — PROPOFOL 1000 MG/100ML IV EMUL
5.0000 ug/kg/min | INTRAVENOUS | Status: DC
  Administered 2018-10-08: 35 ug/kg/min via INTRAVENOUS
  Administered 2018-10-08: 5 ug/kg/min via INTRAVENOUS
  Filled 2018-10-08 (×2): qty 100

## 2018-10-08 MED ORDER — CARVEDILOL 12.5 MG PO TABS
12.5000 mg | ORAL_TABLET | Freq: Two times a day (BID) | ORAL | Status: DC
  Administered 2018-10-08 (×2): 12.5 mg via ORAL
  Filled 2018-10-08 (×2): qty 1

## 2018-10-08 MED ORDER — FENTANYL 2500MCG IN NS 250ML (10MCG/ML) PREMIX INFUSION
0.0000 ug/h | INTRAVENOUS | Status: DC
  Administered 2018-10-08: 25 ug/h via INTRAVENOUS
  Filled 2018-10-08: qty 250

## 2018-10-08 MED ORDER — SODIUM CHLORIDE 0.9 % IV SOLN
INTRAVENOUS | Status: DC
  Administered 2018-10-08: 14:00:00 via INTRAVENOUS

## 2018-10-08 MED ORDER — CARVEDILOL 25 MG PO TABS
25.0000 mg | ORAL_TABLET | Freq: Two times a day (BID) | ORAL | Status: DC
  Administered 2018-10-09 – 2018-10-10 (×3): 25 mg via ORAL
  Filled 2018-10-08 (×3): qty 1

## 2018-10-08 MED ORDER — MIDAZOLAM 50MG/50ML (1MG/ML) PREMIX INFUSION
1.0000 mg/h | INTRAVENOUS | Status: DC
  Administered 2018-10-08 – 2018-10-09 (×2): 0.5 mg/h via INTRAVENOUS
  Filled 2018-10-08 (×2): qty 50

## 2018-10-08 MED ORDER — PRO-STAT SUGAR FREE PO LIQD
30.0000 mL | Freq: Every day | ORAL | Status: DC
  Administered 2018-10-08 – 2018-10-09 (×2): 30 mL
  Filled 2018-10-08 (×2): qty 30

## 2018-10-08 MED ORDER — VITAL HIGH PROTEIN PO LIQD: 1000.0000 mL | ORAL | Status: DC

## 2018-10-08 MED ORDER — CARVEDILOL 12.5 MG PO TABS
12.5000 mg | ORAL_TABLET | Freq: Once | ORAL | Status: AC
  Administered 2018-10-08: 12.5 mg via ORAL
  Filled 2018-10-08: qty 1

## 2018-10-08 MED ORDER — VITAL AF 1.2 CAL PO LIQD
1000.0000 mL | ORAL | Status: DC
  Administered 2018-10-08 – 2018-10-12 (×6): 1000 mL

## 2018-10-08 NOTE — Procedures (Signed)
Patient Name:Eric Archer GOT:[id number] Epilepsy Attending:Shalana Jardin Barbra Sarks Referring Physician/Provider:Paula Moshe Cipro, NP Duration:10/06/2018 1530 to 10/07/2018 1530  Patient history:28 year old male with cardiac arrest,currently on hypothermia protocol. EEG to evaluate for seizures.  Level of alertness:Comatose  Medsduring EEG study:Versed, propofol  Technical aspects: This EEG study was done with scalp electrodes positioned according to the 10-20 International system of electrode placement. Electrical activity was acquired at a sampling rate of 500Hz  and reviewed with a high frequency filter of 70Hz  and a low frequency filter of 1Hz . EEG data were recorded continuously and digitally stored.  DESCRIPTION:EEG initially showed generalized suppression for 2-4seconds intermixed with high amplitude polymorphic sharply contoured 5 to 6 Hz theta activity for 2-4seconds of bursts. Gradually EEG became more continuous and showed 2-5hz  generalized theta-delta slowing. After around 3am on 10/07/2018, EEG gradually worsened and showed generalized suppression.Hyperventilation and photic stimulation were not performed.  IMPRESSION: This study issuggestive of profound diffuse encephalopathy, likely secondary to diffuse anoxic/hypoxic brain injury. No definite seizures and epileptiform discharges were seen during this study.    10/07/2018     10/06/2018   10/05/2018

## 2018-10-08 NOTE — Progress Notes (Signed)
Pt continues on vent at 40 % fio2, pt does not open his eyes, gaze is fixed upward, does move both arms inward and has just this morning started moving his legs nonpurposely. Pt does not cough when suctioned. Pt has had thick clear, pink tinged oral secretions and brown colored gastric secretions from his OGT. Pt breathes over the vent continuously throughout the night and he tenses up. Pt has received numerous PRN medication doses due to tachypnea and agitation.Left radial aline is very positional when pt tenses up. Will continue to monitor.

## 2018-10-08 NOTE — Progress Notes (Addendum)
NAME:  Eric Archer, MRN:  [id number], DOB:  1990-11-16, LOS: 4 ADMISSION DATE:  10/03/2018, CONSULTATION DATE:  10/10/2018 REFERRING MD:  Roderic Palau, CHIEF COMPLAINT:  Cardiac arrest  Brief History   28 year old male with history of substance abuse presented to witnessed cardiac arrest on 9/9--no bystander CPR performed.  EMS arrived and 5 to 8 minutes where patient was found to be in asystole and CPR was initiated. ROSC achieved 15 minutes later for a total time until resuscitation of 20 to 23 minutes.  History of present illness   This is a 28 year old male with past medical history significant for substance abuse presents the ED on 10/21/2018 via EMS status post cardiac arrest.  Cardiac arrest was witnessed by patient's girlfriend.  No bystander CPR was initiated at that time.  Is noted that took EMS about 5 to 8 minutes to arrive at the scene followed by about 15 minutes of CPR after which time ROSC was obtained.  During that time 1 mg Narcan and 1 mg epi.  Patient did not resume spontaneous breathing. At time of presentation is unclear which substances or abuse if any.  Father does note history of prescription drug abuse however he is unsure if patient is currently using any other drugs than marijuana. 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 Labs: Initial lactic acid 8.2.  UDS positive for THC and benzodiazepines.  EtOH 216.  COVID negative.  Initial chest x-ray showed diffuse GGO throughout.  Due to patient's ongoing hypoxia and increasing dilatory requirements, repeat chest x-ray was performed showing worsening airspace disease.  Patient was also noted to have a temperature of 102.4 F.  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  09/28/2018: CT head without contrast: unremarkable  10/05/18 EEG: profound diffuse encephalopathy--likely secondary to diffuse anoxic/hypoxic brain injury    Micro Data:  10/17/2018 blood culture: no growth  Antimicrobials:  Zosyn 9/9 >> vanco 9/9 >>9/10  Interim history/subjective:  Now at goal temperature 37 Foley removed, then urine obtained on I/O.  LR started at 100 cc/h EEG with diffuse slowing as below  -1.7 L today.   Cr slightly up 1.39 (was 1.3) LFTs downtrending.  Trop downtrending Procal elevated WBC elevated Objective   Blood pressure (!) 158/101, pulse (!) 115, temperature 98.6 F (37 C), resp. rate (!) 55, height 5\' 10"  (1.778 m), weight 86.9 kg, SpO2 96 %. CVP:  [9 mmHg-10 mmHg] 9 mmHg  Vent Mode: PRVC FiO2 (%):  [40 %] 40 % Set Rate:  [34 bmp] 34 bmp Vt Set:  [440 mL] 440 mL PEEP:  [5 cmH20-8 cmH20] 5 cmH20 Plateau Pressure:  [14 cmH20-23 cmH20] 14 cmH20   Intake/Output Summary (Last 24 hours) at 10/08/2018 1841 Last data filed at 10/08/2018 1700 Gross per 24 hour  Intake 1014.04 ml  Output 2215 ml  Net -1200.96 ml   Filed Weights   10/08/2018 2256 10/05/18 0700 10/06/18 0600  Weight: 90.7 kg 86.3 kg 86.9 kg    Examination: General: Intubated, comatose HENT: ET tube and OG tube in place Lungs: Coarse bilateral breath sounds, no  wheezes Cardiovascular: Regular, no edema, no murmur Abdomen: Soft, nondistended,No apparent tenderness, minimal bs Neuro: Received fentanyl and versed prior to my exam. Partially opens eyes, has an upward deviated gaze.  Is not moving extremities, does not respond to pain or voice.  Pupils react to light.  Skin: No rash  Resolved Hospital Problem list   Acute renal injury.  AGMA  Assessment & Plan:  This is a  28 year old male status post witnessed cardiac arrest.  No CPR for about 5 to 8 minutes until EMS arrived.  Return of spontaneous circulation after approximately 15 minutes and 1 mg epi 1 mg Narcan.  Respiratory distress with chest x-ray consistent with edema.  TTM done (33 degrees).   Status post cardiac arrest of unknown etiology. Dysrhythmia vs toxin induced vs heart failure vs  Infection. No apparent dysrhythmias on EKG. UDS + for benzo and THC. 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 Left ventricular diffuse hypokinesis.  Ischemic injury vs infectious (myocarditis/pericarditis) vs chronic.  Possible post arrest cm.  Hypertension. Bps 160s-200s Plan:  -Status post TTM, currently 3137 -Appreciate cardiology assistance.  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  He will likely need EP evaluation and catheterization if he recovers from the acute arrest  ARDS/Acute respiratory failure with hypoxia and hypercarbia.  CXR and FiO2/PaO2 86 consistent with severe ARDS.  White count 12-->25. Procalc 40-->80. Was noted to have a Tmax of 102 on admission. COVID neg. No growth on BC at this time. Lipase normal ABG this morning: Arterial Blood Gas result:  pO2 68; pCO2 23; pH 7.39;  HCO3 22, %O2 Sat 96.  CXR developing more focal infiltrate at RML Plan:   -Continue ARDS ventilation strategy -Continue Zosyn empiric for possible aspiration pneumonia -VAP prevention order set -Consider empiric diuresis, goal CVP 5-7  Acute Encephalopathy in setting of cardiac arrest with unknown down time. UDS + for THC and  EEG consistent with anoxic brain injury.  CT head on admission neg  Plan -Appreciate neurology assistance, continuous EEG ongoing  Overnight read pending.  -Need to assess neurological status and prognosis 72 hours after rewarming  Hyperkalemia, resolved  Substance abuse.  Alcohol abuse Plan -Thiamine, folate   Transaminitis--likely liver shock.  -Follow LFT, hepatitis panel negative     Best practice:  Diet: start tube feeds today Pain/Anxiety/Delirium protocol (if indicated): requiring around the clock pain control and sedation, will start infusion. Daily sedation vacations.  VAP protocol (if indicated): Head of bed up 30 degrees DVT prophylaxis: Heparin 5000 units every 8 hours GI prophylaxis: Protonix Mobility: Bedrest Code Status: Full Family Communication:  Disposition: ICU  Independent critical care time 34 minutes

## 2018-10-08 NOTE — Procedures (Signed)
Patient Name:Eric Archer MAY:[id number] Epilepsy Attending:Teagan Ozawa Barbra Sarks Referring Physician/Provider:Paula Moshe Cipro, NP Duration:10/06/2018 1530 to 9/12/20201530  Patient history:28 year old male with cardiac arrest,currently on hypothermia protocol. EEG to evaluate for seizures.  Level of alertness:Comatose  Medsduring EEG study:Versed, propofol  Technical aspects: This EEG study was done with scalp electrodes positioned according to the 10-20 International system of electrode placement. Electrical activity was acquired at a sampling rate of 500Hz  and reviewed with a high frequency filter of 70Hz  and a low frequency filter of 1Hz . EEG data were recorded continuously and digitally stored.  DESCRIPTION:EEG showed 2-5hz  generalized theta-delta slowing. EEG was not reactive to tactile simulation. Hyperventilation and photic stimulation were not performed.  IMPRESSION: This study issuggestive of profound diffuse encephalopathy, likely secondary to diffuse anoxic/hypoxic brain injury. No definite seizures and epileptiform discharges were seen during this study.   EEG appears worsened compared to previous study.  10/08/2018    10/07/2018     10/06/2018   10/05/2018

## 2018-10-08 NOTE — Plan of Care (Signed)
  Problem: Coping: Goal: Level of anxiety will decrease Outcome: Not Progressing   Problem: Pain Managment: Goal: General experience of comfort will improve Outcome: Not Progressing   Problem: Safety: Goal: Ability to remain free from injury will improve Outcome: Progressing  Pt shows signs of agitation such as tensing up arms, tachypnea, hypertension, and tachycardia. No falls experienced throughout the night.

## 2018-10-08 NOTE — Progress Notes (Signed)
Brief Nutrition Note RD working remotely.  Consult received for enteral/tube feeding initiation and management.  Adult Enteral Nutrition Protocol initiated. Full assessment to follow.  Patient was initiated on 36C TTM protocol and has now been rewarmed.   Enteral Access: 16 Fr. OGT placed 9/9; terminates in stomach per abdominal x-ray 9/11; 55 cm at corner of mouth  Utilized goal TF regimen calculation per RD note on 9/10. Initiate Vital AF 1.2 Cal at 25 mL/hr and advance by 20 mL/hr every 8 hours to goal rate of 65 mL/hr. Also provide Pro-Stat 30 mL once daily per tube. Goal regimen provides 1972 kcal, 132 grams of protein, 1265 mL H2O daily.  Monitor magnesium, potassium, and phosphorus daily for at least 3 days, MD to replete as needed, as pt is at risk for refeeding syndrome.  Admitting Dx: Low oxygen saturation [R79.81]  Body mass index is 27.49 kg/m. Pt meets criteria for overweight based on current BMI.  Labs:  Recent Labs  Lab 10/05/18 0328  10/05/18 0601  10/05/18 1410  10/06/18 0908  10/07/18 0034 10/07/18 0538 10/08/18 0441 10/08/18 0516  NA 143   < > 143   < > 144   < > 142   < > 145 145 147* 147*  K 2.8*   < > 2.5*   < > 4.3   < > 4.4   < > 4.4 4.0 3.8 3.8  CL 111   < > 112*   < > 111   < > 111   < > 111 113*  --  110  CO2 20*  --  19*   < > 20*   < > 21*  --   --  22  --  25  BUN 12   < > 12   < > 17   < > 24*   < > 23* 19  --  21*  CREATININE 1.59*   < > 1.30*   < > 1.56*   < > 1.22   < > 1.30* 1.30*  --  1.39*  CALCIUM 8.9  --  8.8*   < > 8.8*   < > 8.5*  --   --  8.2*  --  9.1  MG 1.9  --  2.3  --   --   --   --   --   --   --   --  2.4  PHOS 1.5*  --   --   --  3.3  --   --   --   --   --   --   --   GLUCOSE 92   < > 122*   < > 120*   < > 120*   < > 96 100*  --  95   < > = values in this interval not displayed.   Willey Blade, MS, [place], LDN Office: [phone] Pager: [phone] After Hours/Weekend Pager: [phone]

## 2018-10-08 NOTE — Progress Notes (Signed)
LTM discontinued. Skin breakdown at F4, advised nurse.

## 2018-10-08 NOTE — Progress Notes (Addendum)
NEURO HOSPITALIST PROGRESS NOTE   Subjective: Patient in bed intubated. Breathing above vent. . Has required fentanyl pushes q1hr.  Will wait full 72 hours post- rewarming for a good neurological exam (should be 72 hours 10/10/2018 0045). LTM continues running.   Exam: Vitals:   10/08/18 0700 10/08/18 0800  BP: (!) 182/99 (!) 160/107  Pulse:    Resp: (!) 34 (!) 47  Temp:    SpO2: 96% 96%     Physical Exam  HEENT-  Normocephalic, no lesions, without obvious abnormality.  Normal external eye and conjunctiva.   Cardiovascular-  pulses palpable throughout   Lungs-intubated Extremities- Warm, dry and intact Musculoskeletal-no joint tenderness, deformity or swelling Skin-warm and dry,  Neuro: patient had just received 200 of fentanyl IV push. Mental Status: Patient does not respond to verbal stimuli.    roving eye movements noted. Does not follow commands.  No verbalizations noted.today he did not open his eyes spontaneously or to noxious.  Breathing above vent.  Cranial Nerves: II: patient does not respond confrontation bilaterally,  III,IV,VI:pupils right 2 mm, left 2 mm,and sluggishly reactive bilaterally,  VIII: patient does not respond to verbal stimuli IX,X: gag reflex absent, XI: trapezius strength unable to test bilaterally XII: tongue strength unable to test Motor: Extremities flaccid throughout.  No spontaneous movement noted.  No purposeful movements noted. Sensory: Does not respond to noxious stimuli in any extremity. Opens eyes to noxious. Deep Tendon Reflexes:  Absent throughout. Plantars: muted Cerebellar: Unable to perform  Medications:  Scheduled: . chlorhexidine gluconate (MEDLINE KIT)  15 mL Mouth Rinse BID  . Chlorhexidine Gluconate Cloth  6 each Topical Daily  . folic acid  1 mg Intravenous Daily  . heparin  5,000 Units Subcutaneous Q8H  . mouth rinse  15 mL Mouth Rinse 10 times per day  . pantoprazole (PROTONIX) IV  40 mg  Intravenous QHS  . sodium chloride flush  10-40 mL Intracatheter Q12H  . thiamine  100 mg Intravenous Daily   Continuous: . lactated ringers 10 mL/hr at 10/08/18 0700  . piperacillin-tazobactam (ZOSYN)  IV 12.5 mL/hr at 10/08/18 0700   CYE:LYHTMBPJ (SUBLIMAZE) injection, fentaNYL (SUBLIMAZE) injection, hydrALAZINE, midazolam, midazolam, sodium chloride flush  Pertinent Labs/Diagnostics:   Dg Abd 1 View  Result Date: 10/06/2018 CLINICAL DATA:  Hyperbilirubinemia EXAM: ABDOMEN - 1 VIEW COMPARISON:  None. FINDINGS: Scattered large and small bowel gas is noted. Gastric catheter is noted within the stomach. No bony abnormality is seen. No mass lesion is noted. IMPRESSION: No acute abnormality noted. Electronically Signed   By: [name] M.D.   On: 10/06/2018 14:22    Routine EEG 9/10:  This study issuggestive of profound diffuse encephalopathy, likely secondary to diffuse anoxic/hypoxic brain injury. Frequent bursts of diffuse sharply contoured activity were noted without concomitant clinical signs while patient was on Nimbex.These discharges are on the ictal-interictal continuum.  LTM EEG 9-11 This study issuggestive of profound diffuse encephalopathy, likely secondary to diffuse anoxic/hypoxic brain injury. Frequent bursts of diffuse sharply contoured activity were noted without concomitant clinical signs while patient was on Nimbex.These discharges are on the ictal-interictal continuum. The EEG appears improved compared to previous study.  LTM EEG 10/07/2018: LTM EEG reviewed till  2010 on 10/06/2018. Appears imporved compared to prior study with more continuous 2-5hz  delta-theta slowing. Will review the rest of the study once uploaded and update the team.  LTM READ FROM OVERNIGHT PENDING  Assessment:  Eric Archer is an 28 y.o. male  With tobacco/ ETOH and substance abuse history. transfer from [facility] for TTM status post witnessed cardiac arrest Initial EEG and LTM  EEG yesterday showed frequent bursts of diffusely sharply contoured activity without concomitant signs due to being on Nimbex but after rewarming and discontinuation of Nimbex, EEG has showed some improvement with 2 to 5 Hz delta theta slowing.  Formal read pending.  Recommendations: -- continue LTM --Too early to prognosticate-will need formal exam 72 hours after rewarming. --Supportive care per primary team --No antiepileptics.  EEG improved from before.  I would expect this to continue to improve.   Laurey Morale, MSN, NP-C [facility] [phone]  Attending neurologist's note to follow  Attending Neurohospitalist Addendum Patient seen and examined with APP/Resident. Agree with the history and physical as documented above. Agree with the plan as documented, which I helped formulate. I have independently reviewed the chart, obtained history, review of systems and examined the patient.I have personally reviewed pertinent head/neck/spine imaging (CT/MRI). Please feel free to call with any questions. --- Amie Portland, MD [facility] Pager: [phone]  If 7pm to 7am, please call on call as listed on AMION.    CRITICAL CARE ATTESTATION Performed by: Amie Portland, MD Total critical care time: 30 minutes Critical care time was exclusive of separately billable procedures and treating other patients and/or supervising APPs/Residents/Students Critical care was necessary to treat or prevent imminent or life-threatening deterioration due to hypoxic/ischemic encephalopathy  This patient is critically ill and at significant risk for neurological worsening and/or death and care requires constant monitoring. Critical care was time spent personally by me on the following activities: development of treatment plan with patient and/or surrogate as well as nursing, discussions with consultants, evaluation of patient's response to treatment, examination of patient,  obtaining history from patient or surrogate, ordering and performing treatments and interventions, ordering and review of laboratory studies, ordering and review of radiographic studies, pulse oximetry, re-evaluation of patient's condition, participation in multidisciplinary rounds and medical decision making of high complexity in the care of this patient.

## 2018-10-08 NOTE — Progress Notes (Signed)
Progress Note  Patient Name: Eric Archer Date of Encounter: 10/08/2018  Primary Cardiologist: No primary care provider on file. [place]  Subjective   Intubated,unresponsive   Inpatient Medications    Scheduled Meds: . carvedilol  12.5 mg Oral BID WC  . chlorhexidine gluconate (MEDLINE KIT)  15 mL Mouth Rinse BID  . Chlorhexidine Gluconate Cloth  6 each Topical Daily  . folic acid  1 mg Intravenous Daily  . heparin  5,000 Units Subcutaneous Q8H  . mouth rinse  15 mL Mouth Rinse 10 times per day  . pantoprazole (PROTONIX) IV  40 mg Intravenous QHS  . sodium chloride flush  10-40 mL Intracatheter Q12H  . thiamine  100 mg Intravenous Daily   Continuous Infusions: . lactated ringers 10 mL/hr at 10/08/18 0700  . piperacillin-tazobactam (ZOSYN)  IV 12.5 mL/hr at 10/08/18 0700   PRN Meds: fentaNYL (SUBLIMAZE) injection, hydrALAZINE, midazolam, midazolam, sodium chloride flush   Vital Signs    Vitals:   10/08/18 0500 10/08/18 0600 10/08/18 0700 10/08/18 0800  BP: (!) 148/96 (!) 174/119 (!) 182/99 (!) 160/107  Pulse:    (!) 125  Resp: (!) 34 (!) 34 (!) 34 (!) 47  Temp:    98.8 F (37.1 C)  TempSrc:    Core  SpO2: 95% 100% 96% 96%  Weight:      Height:        Intake/Output Summary (Last 24 hours) at 10/08/2018 0820 Last data filed at 10/08/2018 0700 Gross per 24 hour  Intake 810.05 ml  Output 3015 ml  Net -2204.95 ml   Last 3 Weights 10/06/2018 10/05/2018 10/13/2018  Weight (lbs) 191 lb 9.3 oz 190 lb 4.1 oz 200 lb  Weight (kg) 86.9 kg 86.3 kg 90.719 kg      Telemetry    NSR - Personally Reviewed  ECG    None today - Personally Reviewed  Physical Exam   GEN: Intubated. unresponsive   Neck: No JVD Cardiac: RRR, no murmurs, rubs, or gallops.  Respiratory: Clear to auscultation bilaterally. GI: Soft, nontender, non-distended  MS: No edema; No deformity. Neuro:  intubated and unresponsive  Labs    High Sensitivity Troponin:   Recent Labs   Lab 10/02/2018 2108 10/12/2018 2317 10/05/18 0328 10/05/18 0601 10/06/18 1324  TROPONINIHS 5 89* 699* 778* 144*      Chemistry Recent Labs  Lab 10/05/18 0341  10/06/18 0442  10/06/18 0908  10/07/18 0034 10/07/18 0538 10/08/18 0441 10/08/18 0516  NA  --    < >  --    < > 142   < > 145 145 147* 147*  K  --    < >  --    < > 4.4   < > 4.4 4.0 3.8 3.8  CL  --    < >  --   --  111   < > 111 113*  --  110  CO2  --    < >  --   --  21*  --   --  22  --  25  GLUCOSE  --    < >  --   --  120*   < > 96 100*  --  95  BUN  --    < >  --   --  24*   < > 23* 19  --  21*  CREATININE  --    < >  --   --  1.22   < >  1.30* 1.30*  --  1.39*  CALCIUM  --    < >  --   --  8.5*  --   --  8.2*  --  9.1  PROT 6.8  --  7.3  --   --   --   --   --   --  7.2  ALBUMIN 3.9  --  3.6  --   --   --   --   --   --  3.3*  AST 380*  --  111*  --   --   --   --   --   --  100*  ALT 251*  --  183*  --   --   --   --   --   --  86*  ALKPHOS 97  --  67  --   --   --   --   --   --  87  BILITOT 1.1  --  1.4*  --   --   --   --   --   --  1.2  GFRNONAA  --    < >  --   --  >60  --   --  >60  --  >60  GFRAA  --    < >  --   --  >60  --   --  >60  --  >60  ANIONGAP  --    < >  --   --  10  --   --  10  --  12   < > = values in this interval not displayed.     Hematology Recent Labs  Lab 10/06/18 0442  10/07/18 0538 10/08/18 0441 10/08/18 0516  WBC 24.7*  --  30.5*  --  30.5*  RBC 5.12  --  4.41  --  4.36  HGB 16.0   < > 13.6 12.9* 13.7  HCT 45.6   < > 40.3 38.0* 40.5  MCV 89.1  --  91.4  --  92.9  MCH 31.3  --  30.8  --  31.4  MCHC 35.1  --  33.7  --  33.8  RDW 13.5  --  14.5  --  15.2  PLT 233  --  219  --  233   < > = values in this interval not displayed.    BNPNo results for input(s): BNP, PROBNP in the last 168 hours.   DDimer No results for input(s): DDIMER in the last 168 hours.   Radiology    Dg Abd 1 View  Result Date: 10/06/2018 CLINICAL DATA:  Hyperbilirubinemia EXAM: ABDOMEN - 1 VIEW  COMPARISON:  None. FINDINGS: Scattered large and small bowel gas is noted. Gastric catheter is noted within the stomach. No bony abnormality is seen. No mass lesion is noted. IMPRESSION: No acute abnormality noted. Electronically Signed   By: [name] M.D.   On: 10/06/2018 14:22   Dg Chest Port 1 View  Result Date: 10/08/2018 CLINICAL DATA:  28 year old male with history of respiratory failure and hypoxia. EXAM: PORTABLE CHEST 1 VIEW COMPARISON:  Chest x-ray 10/06/2018. FINDINGS: An endotracheal tube is in place with tip 7.4 cm above the carina. There is a right-sided internal jugular central venous catheter with tip terminating in the proximal superior vena cava. Nasogastric tube extends into the stomach, curled retrograde with tip adjacent to the gastroesophageal junction. Lung volumes are normal. No consolidative airspace disease. No pleural effusions. No pneumothorax. No  pulmonary nodule or mass noted. Pulmonary vasculature and the cardiomediastinal silhouette are within normal limits. IMPRESSION: 1. Support apparatus, as above. 2. Compared to prior study from 10/06/2018 the multifocal airspace disease seen on the prior examination has resolved. Electronically Signed   By: Vinnie Langton M.D.   On: 10/08/2018 08:10    Cardiac Studies   Echo IMPRESSIONS    1. The left ventricle has severely reduced systolic function, with an ejection fraction of 20-25%. The cavity size was normal. There is mild concentric left ventricular hypertrophy. Left ventricular diastolic Doppler parameters are consistent with  pseudonormalization. Left ventricular diffuse hypokinesis.  2. The right ventricle has mildly reduced systolic function. The cavity was mildly enlarged. There is no increase in right ventricular wall thickness. Right ventricular systolic pressure could not be assessed.  3. The tricuspid valve is not assessed.  4. The aortic valve was not well visualized. Aortic valve regurgitation was not  assessed by color flow Doppler.  5. The aorta is not well visualized unless otherwise noted.  Patient Profile     28 y.o. male with polysubstance abuse admitted following out of hospital cardiac arrest- asystole  Assessment & Plan    1. Asystolic cardiac arrest. No bystander CPR. Resuscitation for > 20 minutes. UDS positive for THC and benzodiazepines. ETOH 216. COVID negative.  - No family hx of CAD or premature death.  - He had exertional dyspnea and fatigue while helping his dad with heavy lifting.  Hs-troponin 89>>699>>778 (yesterday). doubt ACS. EKG with TWI in lateral leads. Echo with severely decreased LVEF at 20-25% and diffuse hypokinesis. Will need right and left heart cath if/when Neurologic function recovers - no arrhythmias seen since admission. - will start Coreg today for HTN and tachycardia. Hold off on ACEi/ARB since creatinine is up.  - good urine output with lasix yesterday.   2. Cardiomyopathy. ? Primary cardiomyopathy or reduced EF post resuscitation. Plan cardiac cath when neurologic function recovered. ? Repeat Echo in a week.       For questions or updates, please contact [place] Please consult [email or web address] for contact info under        Signed, Hersey Maclellan [place], MD  10/08/2018, 8:20 AM

## 2018-10-09 ENCOUNTER — Other Ambulatory Visit: Payer: Self-pay

## 2018-10-09 DIAGNOSIS — L8990 Pressure ulcer of unspecified site, unspecified stage: Secondary | ICD-10-CM | POA: Insufficient documentation

## 2018-10-09 DIAGNOSIS — I5021 Acute systolic (congestive) heart failure: Secondary | ICD-10-CM

## 2018-10-09 LAB — GLUCOSE, CAPILLARY
Glucose-Capillary: 111 mg/dL — ABNORMAL HIGH (ref 70–99)
Glucose-Capillary: 114 mg/dL — ABNORMAL HIGH (ref 70–99)
Glucose-Capillary: 119 mg/dL — ABNORMAL HIGH (ref 70–99)
Glucose-Capillary: 122 mg/dL — ABNORMAL HIGH (ref 70–99)
Glucose-Capillary: 98 mg/dL (ref 70–99)
Glucose-Capillary: 99 mg/dL (ref 70–99)

## 2018-10-09 LAB — CBC WITH DIFFERENTIAL/PLATELET
Abs Immature Granulocytes: 0.37 10*3/uL — ABNORMAL HIGH (ref 0.00–0.07)
Basophils Absolute: 0.1 10*3/uL (ref 0.0–0.1)
Basophils Relative: 0 %
Eosinophils Absolute: 0 10*3/uL (ref 0.0–0.5)
Eosinophils Relative: 0 %
HCT: 42.9 % (ref 39.0–52.0)
Hemoglobin: 13.9 g/dL (ref 13.0–17.0)
Immature Granulocytes: 2 %
Lymphocytes Relative: 17 %
Lymphs Abs: 3.6 10*3/uL (ref 0.7–4.0)
MCH: 31 pg (ref 26.0–34.0)
MCHC: 32.4 g/dL (ref 30.0–36.0)
MCV: 95.5 fL (ref 80.0–100.0)
Monocytes Absolute: 1.8 10*3/uL — ABNORMAL HIGH (ref 0.1–1.0)
Monocytes Relative: 8 %
Neutro Abs: 15.7 10*3/uL — ABNORMAL HIGH (ref 1.7–7.7)
Neutrophils Relative %: 73 %
Platelets: 273 10*3/uL (ref 150–400)
RBC: 4.49 MIL/uL (ref 4.22–5.81)
RDW: 15.9 % — ABNORMAL HIGH (ref 11.5–15.5)
WBC: 21.5 10*3/uL — ABNORMAL HIGH (ref 4.0–10.5)
nRBC: 0.3 % — ABNORMAL HIGH (ref 0.0–0.2)

## 2018-10-09 LAB — BASIC METABOLIC PANEL
Anion gap: 13 (ref 5–15)
BUN: 28 mg/dL — ABNORMAL HIGH (ref 6–20)
CO2: 24 mmol/L (ref 22–32)
Calcium: 10 mg/dL (ref 8.9–10.3)
Chloride: 114 mmol/L — ABNORMAL HIGH (ref 98–111)
Creatinine, Ser: 1.22 mg/dL (ref 0.61–1.24)
GFR calc Af Amer: >60 mL/min (ref >60)
GFR calc non Af Amer: >60 mL/min (ref >60)
Glucose, Bld: 119 mg/dL — ABNORMAL HIGH (ref 70–99)
Potassium: 3.6 mmol/L (ref 3.5–5.1)
Sodium: 151 mmol/L — ABNORMAL HIGH (ref 135–145)

## 2018-10-09 LAB — CULTURE, BLOOD (ROUTINE X 2)
Culture: NO GROWTH
Culture: NO GROWTH
Special Requests: ADEQUATE
Special Requests: ADEQUATE

## 2018-10-09 LAB — PHOSPHORUS: Phosphorus: 3 mg/dL (ref 2.5–4.6)

## 2018-10-09 LAB — MAGNESIUM: Magnesium: 2.4 mg/dL (ref 1.7–2.4)

## 2018-10-09 MED ORDER — DEXMEDETOMIDINE HCL IN NACL 200 MCG/50ML IV SOLN
0.4000 ug/kg/h | INTRAVENOUS | Status: DC
  Administered 2018-10-09: 21:00:00 0.402 ug/kg/h via INTRAVENOUS
  Administered 2018-10-09 (×2): 0.4 ug/kg/h via INTRAVENOUS
  Administered 2018-10-10: 08:00:00 0.1 ug/kg/h via INTRAVENOUS
  Administered 2018-10-10: 23:00:00 0.5 ug/kg/h via INTRAVENOUS
  Filled 2018-10-09 (×6): qty 50

## 2018-10-09 MED ORDER — PRO-STAT SUGAR FREE PO LIQD
30.0000 mL | Freq: Two times a day (BID) | ORAL | Status: DC
  Administered 2018-10-09 – 2018-10-12 (×6): 30 mL
  Filled 2018-10-09 (×6): qty 30

## 2018-10-09 MED ORDER — FREE WATER
200.0000 mL | Status: DC
  Administered 2018-10-09 – 2018-10-12 (×17): 200 mL

## 2018-10-09 MED ORDER — PANTOPRAZOLE SODIUM 40 MG PO PACK
40.0000 mg | PACK | Freq: Every day | ORAL | Status: DC
  Administered 2018-10-09 – 2018-10-13 (×5): 40 mg
  Filled 2018-10-09 (×5): qty 20

## 2018-10-09 MED ORDER — SODIUM CHLORIDE 0.9 % IV BOLUS
500.0000 mL | Freq: Once | INTRAVENOUS | Status: AC
  Administered 2018-10-09: 500 mL via INTRAVENOUS

## 2018-10-09 NOTE — Progress Notes (Signed)
28 year old man with a history of substance abuse admitted 9/9 with asystole cardiac arrest , UDS positive for benzodiazepines and marijuana with a EtOH level of 216. He underwent therapeutic hypothermia to 33 degrees and was rewarmed EF was down to 20 to 25% postarrest EEG and LTM EEG showed frequent bursts of high amplitude polymorphic waves without clear seizures, this improved after rewarming  He remains sedated on low-dose Versed and fentanyl drip, propofol was tried 9/13 but discontinued due to high triglycerides Unresponsive, leftward gaze, pupils 2 mm weakly reactive to light, no response to deep painful stimulus, coarse breath sounds bilateral, S1-S2 tacky, saturations dropped to 93% with tachypnea 40s and decreased tidal volume exhaled on ventilator  Chest x-ray 9/14 personally reviewed which shows improved bilateral airspace disease. Labs show hypernatremia mild hypokalemia, leukocytosis, no anemia  Impression/plan  Acute anoxic encephalopathy-exam is concerning however need longer duration of sedation to accurately prognosticate.  Can proceed with MRI brain. We will use Precedex and discontinue Versed and fentanyl drips, okay to use fentanyl PRN, propofol caused hypertriglyceridemia  Acute respiratory failure-seem to be losing tidal volumes due to ET tube being out, was pushed down to 22 cm with improvement in tidal volumes and breath sounds. Not ready to start spontaneous breathing trials. Lower respiratory rate to 20 and consider dropping PEEP once saturation improves Continue Zosyn empiric for aspiration pneumonia.  Cardiac arrest-unclear etiology, postarrest cardiomyopathy will need follow-up echo. May need EP evaluation and cardiac cath if he recovers from this neurologically  Add free water for hypernatremia and continue tube feeds  Mother updated at bedside, guarded prognosis given  The patient is critically ill with multiple organ systems failure and requires high  complexity decision making for assessment and support, frequent evaluation and titration of therapies, application of advanced monitoring technologies and extensive interpretation of multiple databases. Critical Care Time devoted to patient care services described in this note independent of APP/resident  time is 35 minutes.   Leanna Sato Elsworth Soho MD

## 2018-10-09 NOTE — Progress Notes (Signed)
NAME:  Eric Archer, MRN:  [id number], DOB:  01-25-1991, LOS: 5 ADMISSION DATE:  10/18/2018, CONSULTATION DATE:  10/18/2018 REFERRING MD:  Roderic Palau, CHIEF COMPLAINT:  Cardiac arrest  Brief History   28 year old male with history of substance abuse presented to witnessed cardiac arrest on 9/9--no bystander CPR performed.  EMS arrived and 5 to 8 minutes where patient was found to be in asystole and CPR was initiated. ROSC achieved 15 minutes later for a total time until resuscitation of 20 to 23 minutes.  History of present illness   This is a 28 year old male with past medical history significant for substance abuse presents the ED on 09/27/2018 via EMS status post cardiac arrest.  Cardiac arrest was witnessed by patient's girlfriend.  No bystander CPR was initiated at that time.  Is noted that took EMS about 5 to 8 minutes to arrive at the scene followed by about 15 minutes of CPR after which time ROSC was obtained.  During that time 1 mg Narcan and 1 mg epi.  Patient did not resume spontaneous breathing. At time of presentation is unclear which substances or abuse if any.  Father does note history of prescription drug abuse however he is unsure if patient is currently using any other drugs than marijuana. 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 Labs: Initial lactic acid 8.2.  UDS positive for THC and benzodiazepines.  EtOH 216.  COVID negative.  Initial chest x-ray showed diffuse GGO throughout.  Due to patient's ongoing hypoxia and increasing dilatory requirements, repeat chest x-ray was performed showing worsening airspace disease.  Patient was also noted to have a temperature of 102.4 F.  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 10/06/18 CXR: interval improvement in bilateral airspace disease 10/08/18 CXR: resolution of multifocal airspace disease  10/09/2018: CT head without contrast: unremarkable  10/05/18 EEG: profound diffuse encephalopathy--likely secondary to diffuse anoxic/hypoxic brain injury 10/08/18 EEG: suggestive of profound diffuse encephalopathy, likely secondary to diffuse anoxic/hypoxic brain injury.No definite seizures and epileptiform discharges were seen during this study.     Micro Data:  10/20/2018 blood culture: no growth  Antimicrobials:  Zosyn 9/9 >> vanco 9/9 >>9/10  Interim history/subjective:  No acute events overnight. Objective   Blood pressure (!) 137/91, pulse (!) 106, temperature 98.1 F (36.7 C), temperature source Esophageal, resp. rate (!) 33, height 5\' 10"  (1.778 m), weight 88.6 kg, SpO2 96 %. CVP:  [8 mmHg-10 mmHg] 8 mmHg  Vent Mode: PRVC FiO2 (%):  [40 %-50 %] 50 % Set Rate:  [30 bmp-34 bmp] 30 bmp Vt Set:  [440 mL] 440 mL PEEP:  [5 cmH20-10 cmH20] 10 cmH20 Plateau Pressure:  [14 cmH20-20 cmH20] 20 cmH20   Intake/Output Summary (Last 24 hours) at 10/09/2018 0754 Last data filed at 10/09/2018 0700 Gross per 24 hour  Intake 1270.57 ml  Output 1960 ml  Net -689.43 ml   Filed Weights   10/05/18 0700 10/06/18 0600 10/09/18 0448  Weight: 86.3 kg 86.9 kg 88.6 kg    Examination: General: Intubated, comatose HENT: ET tube and OG tube in place Lungs:  Coarse bilateral breath sounds, no wheezes Cardiovascular: Regular, no edema, no murmur Abdomen: Soft, nondistended,No apparent tenderness, minimal bs Neuro: sedated. Does not follow commands. Does not awaken to voice. PERRL. Upward gaze. No gag reflex. No withdrawal to pain. Skin: No rash  Resolved Hospital Problem list    Acute renal injury.  AGMA ARDS  Assessment & Plan:  This is a 28 year old male status post witnessed cardiac arrest.  No CPR for about 5 to 8 minutes until EMS arrived.  Return of spontaneous circulation after approximately 15 minutes and 1 mg epi 1 mg Narcan.  Respiratory distress with chest x-ray consistent with edema.  TTM done (33 degrees).   Status post cardiac arrest of unknown etiology. Dysrhythmia vs toxin induced vs heart failure vs  Infection. No apparent dysrhythmias on EKG. UDS + for benzo and THC. 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 Left ventricular diffuse hypokinesis. 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  Ischemic injury vs infectious (myocarditis/pericarditis) vs chronic.   Plan:  -repeat echo later this week -Appreciate cardiology assistance.   He will likely need EP evaluation and catheterization if he recovers from the acute arrest -coreg and prn hydralazine for hypertension  Acute respiratory failure with hypoxia and hypercarbia.  Resolution of bilateral opacities. White count downtrending. Does appear that [place] is still having to compensate intermittently for fevers.  No growth on blood cultures. Plan:   -Continue ARDS ventilation strategy -zosyn day # 4 -VAP prevention order set  Acute Encephalopathy in setting of cardiac arrest with unknown down time.  EEGs from 9/10 and 9/13 consistent with anoxic brain injury.  CT head on admission neg Poor neurological function at this time however is still in the 72h post warming phase and remains on fentynl and versed  Plan -Appreciate neurology assistance  Substance abuse.  Alcohol abuse Plan -Thiamine, folate  Transaminitis--likely liver shock. Improving -Follow LFT, hepatitis panel negative  Best practice:  Diet: tube feeds  Pain/Anxiety/Delirium protocol (if indicated): attempt to wean as able VAP protocol (if indicated): Head of bed up 30 degrees DVT  prophylaxis: Heparin 5000 units every 8 hours GI prophylaxis: Protonix Mobility: Bedrest Code Status: Full Family Communication: mother at bedside. She still seems hopeful for meaningful recovery however does acknowledge that this may not occur. Disposition: ICU  Elige Radonylee Shaquandra Galano, MD Internal Medicine Resident-PGY1 10/09/18 10:40 AM

## 2018-10-09 NOTE — Progress Notes (Signed)
Progress Note  Patient Name: Eric Archer Date of Encounter: 10/09/2018  Primary Cardiologist: Hochrein  Subjective   Intubated and sedated.  Inpatient Medications    Scheduled Meds:  carvedilol  25 mg Oral BID WC   chlorhexidine gluconate (MEDLINE KIT)  15 mL Mouth Rinse BID   Chlorhexidine Gluconate Cloth  6 each Topical Daily   feeding supplement (PRO-STAT SUGAR FREE 64)  30 mL Per Tube BID   folic acid  1 mg Intravenous Daily   heparin  5,000 Units Subcutaneous Q8H   mouth rinse  15 mL Mouth Rinse 10 times per day   pantoprazole (PROTONIX) IV  40 mg Intravenous QHS   sodium chloride flush  10-40 mL Intracatheter Q12H   thiamine  100 mg Intravenous Daily   Continuous Infusions:  sodium chloride Stopped (10/08/18 2109)   feeding supplement (VITAL AF 1.2 CAL) 65 mL/hr at 10/09/18 1000   fentaNYL infusion INTRAVENOUS 50 mcg/hr (10/09/18 0900)   lactated ringers Stopped (10/08/18 1403)   midazolam 0.5 mg/hr (10/09/18 0700)   piperacillin-tazobactam (ZOSYN)  IV 12.5 mL/hr at 10/09/18 0900   PRN Meds: fentaNYL (SUBLIMAZE) injection, hydrALAZINE, midazolam, sodium chloride flush   Vital Signs    Vitals:   10/09/18 0745 10/09/18 0800 10/09/18 0900 10/09/18 1030  BP:  (!) 151/106 (!) 144/113   Pulse:      Resp:  (!) 30 (!) 28   Temp: 99.3 F (37.4 C)   (!) 96.1 F (35.6 C)  TempSrc: Esophageal   Esophageal  SpO2:  95% 97%   Weight:      Height:        Intake/Output Summary (Last 24 hours) at 10/09/2018 1048 Last data filed at 10/09/2018 1000 Gross per 24 hour  Intake 1481.99 ml  Output 1610 ml  Net -128.01 ml   Last 3 Weights 10/09/2018 10/06/2018 10/05/2018  Weight (lbs) 195 lb 5.2 oz 191 lb 9.3 oz 190 lb 4.1 oz  Weight (kg) 88.6 kg 86.9 kg 86.3 kg      Telemetry    Normal sinus rhythm- Personally Reviewed  ECG    Most recently performed on 10/05/2018 demonstrated prominent voltage, interventricular conduction abnormality  (right bundle) and nonspecific ST-T wave abnormality.- Personally Reviewed  Physical Exam  Intubated and motionless Family member at bedside GEN: No acute distress.   Neck: No JVD Cardiac: RRR, no murmurs, rubs, or gallops.  Respiratory: Clear to auscultation bilaterally.  Mechanical ventilator sounds Neuro:   Unresponsive   Labs    High Sensitivity Troponin:   Recent Labs  Lab 09/30/2018 2108 09/27/2018 2317 10/05/18 0328 10/05/18 0601 10/06/18 1324  TROPONINIHS 5 89* 699* 778* 144*      Chemistry Recent Labs  Lab 10/05/18 0341  10/06/18 0442  10/07/18 0538  10/08/18 0516 10/08/18 2232 10/09/18 0948  NA  --    < >  --    < > 145   < > 147* 149* 151*  K  --    < >  --    < > 4.0   < > 3.8 3.4* 3.6  CL  --    < >  --    < > 113*  --  110  --  114*  CO2  --    < >  --    < > 22  --  25  --  24  GLUCOSE  --    < >  --    < > 100*  --  95  --  119*  BUN  --    < >  --    < > 19  --  21*  --  28*  CREATININE  --    < >  --    < > 1.30*  --  1.39*  --  1.22  CALCIUM  --    < >  --    < > 8.2*  --  9.1  --  10.0  PROT 6.8  --  7.3  --   --   --  7.2  --   --   ALBUMIN 3.9  --  3.6  --   --   --  3.3*  --   --   AST 380*  --  111*  --   --   --  100*  --   --   ALT 251*  --  183*  --   --   --  86*  --   --   ALKPHOS 97  --  67  --   --   --  87  --   --   BILITOT 1.1  --  1.4*  --   --   --  1.2  --   --   GFRNONAA  --    < >  --    < > >60  --  >60  --  >60  GFRAA  --    < >  --    < > >60  --  >60  --  >60  ANIONGAP  --    < >  --    < > 10  --  12  --  13   < > = values in this interval not displayed.     Hematology Recent Labs  Lab 10/07/18 0538  10/08/18 0516 10/08/18 2232 10/09/18 0948  WBC 30.5*  --  30.5*  --  21.5*  RBC 4.41  --  4.36  --  4.49  HGB 13.6   < > 13.7 12.9* 13.9  HCT 40.3   < > 40.5 38.0* 42.9  MCV 91.4  --  92.9  --  95.5  MCH 30.8  --  31.4  --  31.0  MCHC 33.7  --  33.8  --  32.4  RDW 14.5  --  15.2  --  15.9*  PLT 219  --  233  --   273   < > = values in this interval not displayed.    BNPNo results for input(s): BNP, PROBNP in the last 168 hours.   DDimer No results for input(s): DDIMER in the last 168 hours.   Radiology    Dg Chest Port 1 View  Result Date: 10/08/2018 CLINICAL DATA:  Central line placement. EXAM: PORTABLE CHEST 1 VIEW COMPARISON:  10/08/2018 at 5:45 a.m. FINDINGS: Enteric tube, endotracheal tube unchanged. Right IJ central venous catheter with tip over the SVC. Lungs are adequately inflated with persistent airspace opacification over the right infrahilar region/medial right base. No effusion. No pneumothorax. Cardiomediastinal silhouette and remainder of the exam is unchanged. IMPRESSION: Persistent airspace process over the medial right base/infrahilar region. Tubes and lines as described. Electronically Signed   By: Marin Olp M.D.   On: 10/08/2018 13:48   Dg Chest Port 1 View  Result Date: 10/08/2018 CLINICAL DATA:  28 year old male with history of respiratory failure and hypoxia. EXAM: PORTABLE CHEST 1 VIEW COMPARISON:  Chest x-ray 10/06/2018. FINDINGS: An endotracheal tube  is in place with tip 7.4 cm above the carina. There is a right-sided internal jugular central venous catheter with tip terminating in the proximal superior vena cava. Nasogastric tube extends into the stomach, curled retrograde with tip adjacent to the gastroesophageal junction. Lung volumes are normal. No consolidative airspace disease. No pleural effusions. No pneumothorax. No pulmonary nodule or mass noted. Pulmonary vasculature and the cardiomediastinal silhouette are within normal limits. IMPRESSION: 1. Support apparatus, as above. 2. Compared to prior study from 10/06/2018 the multifocal airspace disease seen on the prior examination has resolved. Electronically Signed   By: Vinnie Langton M.D.   On: 10/08/2018 08:10    Cardiac Studies   2D Doppler echocardiogram 10/05/2018 IMPRESSIONS    1. The left ventricle has  severely reduced systolic function, with an ejection fraction of 20-25%. The cavity size was normal. There is mild concentric left ventricular hypertrophy. Left ventricular diastolic Doppler parameters are consistent with  pseudonormalization. Left ventricular diffuse hypokinesis.  2. The right ventricle has mildly reduced systolic function. The cavity was mildly enlarged. There is no increase in right ventricular wall thickness. Right ventricular systolic pressure could not be assessed.  3. The tricuspid valve is not assessed.  4. The aortic valve was not well visualized. Aortic valve regurgitation was not assessed by color flow Doppler.  5. The aorta is not well visualized unless otherwise noted.  Patient Profile     28 y.o. male with polysubstance abuse admitted following out of hospital cardiac arrest- asystole.  Assessment & Plan    1. Bradycardia asystolic cardiac arrest with prolonged downtime before ROSC 2. Acute systolic heart failure: Systolic function pre-arrest is unknown. 3. Encephalopathy 4. Severe hypertension: Continue carvedilol and hydralazine.  Plan supportive care from cardiac standpoint.  Aggressiveness of further work-up will depend upon recovery of neuro function.       For questions or updates, please contact [facility] Please consult [email or web address] for contact info under        Signed, Sinclair Grooms, MD  10/09/2018, 10:48 AM

## 2018-10-09 NOTE — Plan of Care (Addendum)
Pt's fi02 has been increased to 50% due to ABG results per RRT.   Problem: Nutrition: Goal: Adequate nutrition will be maintained Outcome: Progressing   Problem: Elimination: Goal: Will not experience complications related to urinary retention Outcome: Progressing   Problem: Pain Managment: Goal: General experience of comfort will improve Outcome: Progressing    Pt was started on tube feedings yesterday and pt has tolerated them well throughout the night, active bowel sounds are heard. Pt has voided well. Pt has had less signs of pain compared to the night before. Pt's RASS score has decreased, pt is less reactive in his extremities. No coughing noted when suctioning ETT. Pt continues to have a moderate amount of oral secretions. Will continue to monitor.

## 2018-10-09 NOTE — Progress Notes (Signed)
Nutrition Follow-up  DOCUMENTATION CODES:   Not applicable  INTERVENTION:   Continue tube feeding:  -Vital AF 1.2 @ 65 ml/hr via OGT -30 ml Prostat BID  Provides: 2072 kcals, 147 grams protein, 1265 ml free water. Meets 98% kcal needs and 100% of protein needs.   NUTRITION DIAGNOSIS:   Inadequate oral intake related to inability to eat as evidenced by NPO status.  Ongoing  GOAL:   Patient will meet greater than or equal to 90% of their needs  Addressed via TF  MONITOR:   Vent status, Labs, Weight trends, I & O's  REASON FOR ASSESSMENT:   Ventilator    ASSESSMENT:   28 year old male who presented to the ED on 9/09 after a witnessed episode of choking. Pt with cardiac and respiratory arrest, CPR initiated and ROSC obtained. Pt required intubation. PMH of tobacco/EtOH/substance abuse. TTM 33 degrees initiated.   RD working remotely.  Off sedation. TTM to be complete on 9/15. EEG pending- suspected anoxic brain injury. Needs L heart cath if/when neurologic function recovers. Spoke with RN via phone- pt tolerating Vital AF at goal.   Admission weight: 86.3 kg Current weight: 88.6 kg   Patient remains intubated on ventilator support MV: 12 L/min Temp (24hrs), Avg:98.7 F (37.1 C), Min:97.7 F (36.5 C), Max:99.7 F (37.6 C)    I/O: -845 ml since admit UOP: 1,535 ml x 24 hrs  OGT: 425 ml x 24 hrs   Drips: versed, abx Medications: folic acid, thiamine  Labs: Na 147 (H) TGs 425 (H) Cr 1.39- trending up  Diet Order:   Diet Order    None      EDUCATION NEEDS:   No education needs have been identified at this time  Skin:  Skin Assessment: Skin Integrity Issues: Skin Integrity Issues:: Stage I Stage I: upper face from EEG lead  Last BM:  PTA  Height:   Ht Readings from Last 1 Encounters:  2018/10/17 5\' 10"  (1.778 m)    Weight:   Wt Readings from Last 1 Encounters:  10/09/18 88.6 kg    Ideal Body Weight:  75.5 kg  BMI:  Body mass index is  28.03 kg/m.  Estimated Nutritional Needs:   Kcal:  2117 kcal  Protein:  125-145 grams  Fluid:  >/= 2 L/day   Mariana Single RD, LDN Clinical Nutrition Pager # - [phone]

## 2018-10-09 NOTE — Progress Notes (Signed)
[facility] Progress Note Patient Name: RITIK STAVOLA DOB: 13-Oct-1990 MRN: [id number]   Date of Service  10/09/2018  HPI/Events of Note  Hypotension - BP = 81/47 with MAP = 59. LVEF = 20-25%.   eICU Interventions  Will order: 1. Monitor CVP Q 4 hours.  2. Bolus with 0.9 NaCl 500 mL IV over 30 minutes now.      Intervention Category Major Interventions: Hypotension - evaluation and management  Lysle Dingwall 10/09/2018, 7:35 PM

## 2018-10-09 NOTE — Progress Notes (Signed)
Offered spiritual presence and prayer to Eric Archer's mom and [name].  Mom, Mrs. Eric Archer, told me to check on [name] whenever I needed to.  I will continue to follow-up with [name] and his family throughout his time here.

## 2018-10-09 NOTE — Plan of Care (Signed)
Pt current VSS. Not following commands or withdrawing to pain. Pupils sluggish but reactive. BP soft, 500 cc bolus given this shift per MD Elsworth Soho. Pt now on precedex. Fentanyl and versed d/c. Tube feeds going ok. Pt has lots of secretions. Q1-2 hr oral care done. Family at bedside. No new changes at this time. Will continue to monitor.  Problem: Clinical Measurements: Goal: Will remain free from infection Outcome: Progressing Goal: Respiratory complications will improve Outcome: Progressing Goal: Cardiovascular complication will be avoided Outcome: Progressing   Problem: Nutrition: Goal: Adequate nutrition will be maintained Outcome: Progressing   Problem: Elimination: Goal: Will not experience complications related to bowel motility Outcome: Progressing Goal: Will not experience complications related to urinary retention Outcome: Progressing   Problem: Pain Managment: Goal: General experience of comfort will improve Outcome: Progressing   Problem: Safety: Goal: Ability to remain free from injury will improve Outcome: Progressing   Problem: Skin Integrity: Goal: Risk for impaired skin integrity will decrease Outcome: Progressing

## 2018-10-09 NOTE — Progress Notes (Signed)
Reason for consult: Prognostication following anoxic brain injury   Subjective: No significant change in exam.  Patient receiving 0.5 mg/hr of Versed as well as fentanyl pushes.   ROS: Unable to obtain due to poor mental status  Examination  Vital signs in last 24 hours: Temp:  [97.7 F (36.5 C)-99.7 F (37.6 C)] 99.3 F (37.4 C) (09/14 0745) Pulse Rate:  [100-118] 106 (09/14 0733) Resp:  [19-55] 28 (09/14 0900) BP: (105-199)/(71-131) 144/113 (09/14 0900) SpO2:  [92 %-98 %] 97 % (09/14 0900) Arterial Line BP: (88-194)/(65-118) 136/91 (09/14 0745) FiO2 (%):  [40 %-50 %] 50 % (09/14 0745) Weight:  [88.6 kg] 88.6 kg (09/14 0448)  General: lying in bed CVS: pulse-normal rate and rhythm RS: breathing comfortably over the ventilator Extremities: normal   Neuro: Mental Status: Patient does not respond to verbal stimuli.  Does not respond to deep sternal rub.  Does not follow commands.  No verbalizations noted.  Cranial Nerves: II: Pupils are equal bilaterally at 3 mm, very sluggish III,IV,VI: doll's response present V,VII: corneal reflex: Present VIII: patient does not respond to verbal stimuli IX,X: gag reflex absent XI: trapezius strength unable to test bilaterally XII: tongue strength unable to test Motor: Extremities flaccid throughout.  No spontaneous movement noted.  No purposeful movements noted Sensory: Does not respond to noxious stimuli in any extremity. Deep Tendon Reflexes: 2+ bilaterally over biceps, patella Plantars: Mute Cerebellar: Unable to perform  Basic Metabolic Panel: Recent Labs  Lab 10/05/18 0328  10/05/18 0601  10/05/18 1410 10/05/18 1925 10/06/18 0137  10/06/18 0908  10/06/18 2025 10/06/18 2223 10/07/18 0034 10/07/18 [id number] 10/08/18 0441 10/08/18 0516 10/08/18 2232 10/09/18 0425  NA 143   < > 143   < > 144 143 142   < > 142   < > 144 147* 145 145 147* 147* 149*  --   K 2.8*   < > 2.5*   < > 4.3 5.4* 5.1   < > 4.4   < > 4.1 3.7 4.4 4.0  3.8 3.8 3.4*  --   CL 111   < > 112*   < > 111 110 113*  --  111   < > 111 113* 111 113*  --  110  --   --   CO2 20*  --  19*   < > 20* 20* 20*  --  21*  --   --   --   --  22  --  25  --   --   GLUCOSE 92   < > 122*   < > 120* 116* 136*  --  120*   < > 113* 91 96 100*  --  95  --   --   BUN 12   < > 12   < > 17 19 21*  --  24*   < > 23* 20 23* 19  --  21*  --   --   CREATININE 1.59*   < > 1.30*   < > 1.56* 1.34* 1.08  --  1.22   < > 1.10 0.90 1.30* 1.30*  --  1.39*  --   --   CALCIUM 8.9  --  8.8*   < > 8.8* 8.5* 8.1*  --  8.5*  --   --   --   --  8.2*  --  9.1  --   --   MG 1.9  --  2.3  --   --   --   --   --   --   --   --   --   --   --   --  2.4  --  2.4  PHOS 1.5*  --   --   --  3.3  --   --   --   --   --   --   --   --   --   --   --   --  3.0   < > = values in this interval not displayed.    CBC: Recent Labs  Lab October 19, 2018 2108  10/05/18 0328  10/06/18 0442  10/07/18 0034 10/07/18 0538 10/08/18 0441 10/08/18 0516 10/08/18 2232  WBC 12.2*  --  12.9*  --  24.7*  --   --  30.5*  --  30.5*  --   NEUTROABS 3.5  --   --   --   --   --   --   --   --   --   --   HGB 14.9   < > 17.4*   < > 16.0   < > 13.3 13.6 12.9* 13.7 12.9*  HCT 44.6   < > 50.7   < > 45.6   < > 39.0 40.3 38.0* 40.5 38.0*  MCV 96.7  --  90.1  --  89.1  --   --  91.4  --  92.9  --   PLT 283  --  295  --  233  --   --  219  --  233  --    < > = values in this interval not displayed.     Coagulation Studies: No results for input(s): LABPROT, INR in the last 72 hours.  Imaging Reviewed:     ASSESSMENT AND PLAN  Patient is a 28 year old male with past medical history of substance abuse, EtOH abuse status post witnessed cardiac arrest, transferred from [facility] for [facility]. Patient on LTM, showed initially burst suppression pattern.  After discontinuation Nimbex EEG had some improvement with 2 to 5 Hz delta theta slowing.  EEG was not reactive.  TTM will be completed 10/10/2018 at 00 45.  Impression Suspect  anoxic injury status post cardiac arrest, now day 3 post resuscitation  Recommendations MRI brain when stable Continue serial neurological exam, too early to prognosticate -however my initial assessment is concerning for a poor prognostication  Continue to wean sedation as much as possible Treat underlying metabolic conditions and respiratory failure   The patient is neurologically critically ill due to high concern for anoxic brain injury.  I spent 25 minutes with assessment and evaluation of this patient.    Karena Addison Aroor [facility] Pager Number [phone] For questions after 7pm please refer to AMION to reach the Neurologist on call

## 2018-10-10 ENCOUNTER — Inpatient Hospital Stay (HOSPITAL_COMMUNITY): Payer: Medicaid Other

## 2018-10-10 DIAGNOSIS — G9340 Encephalopathy, unspecified: Secondary | ICD-10-CM

## 2018-10-10 LAB — CBC
HCT: 42.1 % (ref 39.0–52.0)
Hemoglobin: 13.5 g/dL (ref 13.0–17.0)
MCH: 31.3 pg (ref 26.0–34.0)
MCHC: 32.1 g/dL (ref 30.0–36.0)
MCV: 97.5 fL (ref 80.0–100.0)
Platelets: 267 10*3/uL (ref 150–400)
RBC: 4.32 MIL/uL (ref 4.22–5.81)
RDW: 16.2 % — ABNORMAL HIGH (ref 11.5–15.5)
WBC: 21.6 10*3/uL — ABNORMAL HIGH (ref 4.0–10.5)
nRBC: 0.3 % — ABNORMAL HIGH (ref 0.0–0.2)

## 2018-10-10 LAB — GLUCOSE, CAPILLARY
Glucose-Capillary: 103 mg/dL — ABNORMAL HIGH (ref 70–99)
Glucose-Capillary: 104 mg/dL — ABNORMAL HIGH (ref 70–99)
Glucose-Capillary: 118 mg/dL — ABNORMAL HIGH (ref 70–99)
Glucose-Capillary: 129 mg/dL — ABNORMAL HIGH (ref 70–99)
Glucose-Capillary: 129 mg/dL — ABNORMAL HIGH (ref 70–99)
Glucose-Capillary: 91 mg/dL (ref 70–99)

## 2018-10-10 LAB — BASIC METABOLIC PANEL
Anion gap: 12 (ref 5–15)
BUN: 35 mg/dL — ABNORMAL HIGH (ref 6–20)
CO2: 24 mmol/L (ref 22–32)
Calcium: 9.9 mg/dL (ref 8.9–10.3)
Chloride: 119 mmol/L — ABNORMAL HIGH (ref 98–111)
Creatinine, Ser: 1.19 mg/dL (ref 0.61–1.24)
GFR calc Af Amer: >60 mL/min (ref >60)
GFR calc non Af Amer: >60 mL/min (ref >60)
Glucose, Bld: 109 mg/dL — ABNORMAL HIGH (ref 70–99)
Potassium: 4 mmol/L (ref 3.5–5.1)
Sodium: 155 mmol/L — ABNORMAL HIGH (ref 135–145)

## 2018-10-10 LAB — PHOSPHORUS: Phosphorus: 5.5 mg/dL — ABNORMAL HIGH (ref 2.5–4.6)

## 2018-10-10 LAB — MAGNESIUM: Magnesium: 2.5 mg/dL — ABNORMAL HIGH (ref 1.7–2.4)

## 2018-10-10 MED ORDER — DEXTROSE 5 % IV SOLN
INTRAVENOUS | Status: DC
  Administered 2018-10-10 – 2018-10-11 (×3): via INTRAVENOUS
  Administered 2018-10-11: 01:00:00 75 mL/h via INTRAVENOUS
  Administered 2018-10-11 – 2018-10-13 (×4): via INTRAVENOUS

## 2018-10-10 MED ORDER — LACTATED RINGERS IV BOLUS
500.0000 mL | Freq: Once | INTRAVENOUS | Status: AC
  Administered 2018-10-10: 10:00:00 500 mL via INTRAVENOUS

## 2018-10-10 MED ORDER — CARVEDILOL 6.25 MG PO TABS
6.2500 mg | ORAL_TABLET | Freq: Two times a day (BID) | ORAL | Status: DC
  Administered 2018-10-12 – 2018-10-13 (×4): 6.25 mg via ORAL
  Filled 2018-10-10 (×5): qty 1

## 2018-10-10 NOTE — Progress Notes (Signed)
Reason for consult: Prognostication following anoxic brain injury  Subjective: No significant change in examination   ROS: Unable to obtain due to poor mental status  Examination  Vital signs in last 24 hours: Temp:  [98.1 F (36.7 C)-99.3 F (37.4 C)] 99.3 F (37.4 C) (09/15 1800) Resp:  [15-36] 36 (09/15 1800) BP: (82-140)/(51-99) 127/68 (09/15 1800) SpO2:  [91 %-99 %] 91 % (09/15 1800) Arterial Line BP: (76-172)/(53-153) 104/75 (09/15 1800) FiO2 (%):  [50 %] 50 % (09/15 1800) Weight:  [83.8 kg] 83.8 kg (09/15 0400)  General: lying in bed. CVS: pulse-normal rate and rhythm RS: breathing comfortably Extremities: normal   Neuro: Mental Status: Patient does not respond to verbal stimuli.  Does not respond to deep sternal rub.  Does not follow commands.  No verbalizations noted.  Cranial Nerves: II: patient does not respond confrontation bilaterally, pupils, right  2 mm, left 2 mm, very sluggish  III,IV,VI: doll's response present. Cold caloric responses absent bilaterally. V,VII: corneal reflex: absent  VIII: patient does not respond to verbal stimuli IX,X: gag reflex absent  XI: trapezius strength unable to test bilaterally XII: tongue strength unable to test Motor: Extremities flaccid throughout.  No spontaneous movement noted.  No purposeful movements noted.  Sensory: Does not respond to noxious stimuli in any extremity. Deep Tendon Reflexes:  Absent throughout. Plantars: mute  Cerebellar: Unable to perform   Basic Metabolic Panel: Recent Labs  Lab 10/05/18 0328  10/05/18 0601  10/05/18 1410  10/06/18 0908  10/07/18 0034 10/07/18 0865 10/08/18 0441 10/08/18 0516 10/08/18 2232 10/09/18 0425 10/09/18 0948 10/10/18 0427  NA 143   < > 143   < > 144   < > 142   < > 145 145 147* 147* 149*  --  151* 155*  K 2.8*   < > 2.5*   < > 4.3   < > 4.4   < > 4.4 4.0 3.8 3.8 3.4*  --  3.6 4.0  CL 111   < > 112*   < > 111   < > 111   < > 111 113*  --  110  --   --   114* 119*  CO2 20*  --  19*   < > 20*   < > 21*  --   --  22  --  25  --   --  24 24  GLUCOSE 92   < > 122*   < > 120*   < > 120*   < > 96 100*  --  95  --   --  119* 109*  BUN 12   < > 12   < > 17   < > 24*   < > 23* 19  --  21*  --   --  28* 35*  CREATININE 1.59*   < > 1.30*   < > 1.56*   < > 1.22   < > 1.30* 1.30*  --  1.39*  --   --  1.22 1.19  CALCIUM 8.9  --  8.8*   < > 8.8*   < > 8.5*  --   --  8.2*  --  9.1  --   --  10.0 9.9  MG 1.9  --  2.3  --   --   --   --   --   --   --   --  2.4  --  2.4  --  2.5*  PHOS 1.5*  --   --   --  3.3  --   --   --   --   --   --   --   --  3.0  --  5.5*   < > = values in this interval not displayed.    CBC: Recent Labs  Lab 2019-01-19 2108  10/06/18 0442  10/07/18 0538 10/08/18 0441 10/08/18 0516 10/08/18 2232 10/09/18 0948 10/10/18 0427  WBC 12.2*   < > 24.7*  --  30.5*  --  30.5*  --  21.5* 21.6*  NEUTROABS 3.5  --   --   --   --   --   --   --  15.7*  --   HGB 14.9   < > 16.0   < > 13.6 12.9* 13.7 12.9* 13.9 13.5  HCT 44.6   < > 45.6   < > 40.3 38.0* 40.5 38.0* 42.9 42.1  MCV 96.7   < > 89.1  --  91.4  --  92.9  --  95.5 97.5  PLT 283   < > 233  --  219  --  233  --  273 267   < > = values in this interval not displayed.     Coagulation Studies: No results for input(s): LABPROT, INR in the last 72 hours.  Imaging Reviewed:     ASSESSMENT AND PLAN   Patient is a 28 year old male with past medical history of substance abuse, EtOH abuse status post witnessed cardiac arrest, transferred from [facility] for TTM. Patient on LTM, showed initially burst suppression pattern.  After discontinuation Nimbex EEG had some improvement with 2 to 5 Hz delta theta slowing.  EEG was not reactive.   Impression Anoxic brain injury  Recommendations MRI brain when stable Continue serial neurological exam, too early to prognosticate -however my initial assessment is concerning for a poor prognostication  Continue to wean sedation as much as  possible Treat underlying metabolic conditions and respiratory failure   The patient is neurologically critically ill due to high concern for anoxic brain injury.  I spent 25 minutes with assessment and evaluation of this patient including discussion with mother.      Georgiana SpinnerSushanth Chistine Dematteo [facility] Pager Number [phone] For questions after 7pm please refer to AMION to reach the Neurologist on call

## 2018-10-10 NOTE — Progress Notes (Signed)
28 year old man with a history of substance abuse admitted 9/9 with asystole cardiac arrest , UDS positive for benzodiazepines and marijuana with a EtOH level of 216. He underwent therapeutic hypothermia to 33 degrees and was rewarmed EF was down to 20 to 25% postarrest EEG and LTM EEG showed frequent bursts of high amplitude polymorphic waves without clear seizures, this improved after rewarming  On 9/14 we stopped Versed and fentanyl and transition to low-dose Precedex. He remains unresponsive to deep pain stimulus, upward gaze, pupils 2 mm bilaterally not reactive to light, coarse breath sounds especially on the left, S1-S2 tachycardic, soft and nontender abdomen, bilateral upgoing plantars.  Chest x-ray 9/15 was personally reviewed which shows clear on right and left basilar airspace disease.  Labs show increasing sodium , stable leukocytosis.  Impression/plan  Anoxic encephalopathy-this remains the main issue here, proceed with MRI brain today, exam on low-dose Precedex this morning off Versed and fentanyl drips for 24 hours is concerning for anoxic damage  Acute respiratory failure-lower PEEP to 5, start spontaneous breathing trials Continue Zosyn for aspiration pneumonia, respiratory culture was not obtained.  Cardiac arrest -unclear etiology, will need follow-up echo, EP evaluation and cath if there is neurological recovery.  Hypernatremia-add D5W 75/hour and recheck. Hypotension-decrease Coreg to 6.25 twice daily, LR 500 bolus.  Mother updated at bedside, guarded prognosis given for neurological recovery but waiting on full prognostication  The patient is critically ill with multiple organ systems failure and requires high complexity decision making for assessment and support, frequent evaluation and titration of therapies, application of advanced monitoring technologies and extensive interpretation of multiple databases. Critical Care Time devoted to patient care services  described in this note independent of APP/resident  time is 35 minutes.   Leanna Sato Elsworth Soho MD

## 2018-10-10 NOTE — Progress Notes (Signed)
Progress Note  Patient Name: Eric Archer Date of Encounter: 10/10/2018  Primary Cardiologist: Hochrein  Subjective   Intubated and sedated.  No real change in status since yesterday.    Inpatient Medications    Scheduled Meds: . carvedilol  6.25 mg Oral BID WC  . chlorhexidine gluconate (MEDLINE KIT)  15 mL Mouth Rinse BID  . Chlorhexidine Gluconate Cloth  6 each Topical Daily  . feeding supplement (PRO-STAT SUGAR FREE 64)  30 mL Per Tube BID  . folic acid  1 mg Intravenous Daily  . free water  200 mL Per Tube Q4H  . heparin  5,000 Units Subcutaneous Q8H  . mouth rinse  15 mL Mouth Rinse 10 times per day  . pantoprazole sodium  40 mg Per Tube Q1200  . sodium chloride flush  10-40 mL Intracatheter Q12H  . thiamine  100 mg Intravenous Daily   Continuous Infusions: . sodium chloride Stopped (10/08/18 2109)  . dexmedetomidine (PRECEDEX) IV infusion Stopped (10/10/18 0940)  . dextrose 75 mL/hr at 10/10/18 1003  . feeding supplement (VITAL AF 1.2 CAL) 1,000 mL (10/10/18 0401)  . lactated ringers Stopped (10/08/18 1403)  . piperacillin-tazobactam (ZOSYN)  IV Stopped (10/10/18 0952)   PRN Meds: fentaNYL (SUBLIMAZE) injection, hydrALAZINE, midazolam, sodium chloride flush   Vital Signs    Vitals:   10/10/18 0740 10/10/18 0759 10/10/18 0800 10/10/18 1000  BP:   (!) 118/91 (!) 96/55  Pulse:      Resp:   15 (!) 24  Temp: 99.3 F (37.4 C)  99.1 F (37.3 C) 99 F (37.2 C)  TempSrc:   Esophageal Esophageal  SpO2:  96% 96% 96%  Weight:      Height:        Intake/Output Summary (Last 24 hours) at 10/10/2018 1117 Last data filed at 10/10/2018 1000 Gross per 24 hour  Intake 2099.46 ml  Output 665 ml  Net 1434.46 ml   Last 3 Weights 10/10/2018 10/09/2018 10/06/2018  Weight (lbs) 184 lb 11.9 oz 195 lb 5.2 oz 191 lb 9.3 oz  Weight (kg) 83.8 kg 88.6 kg 86.9 kg      Telemetry    Normal sinus rhythm- Personally Reviewed  ECG    Most recently performed on  10/05/2018 demonstrated prominent voltage, interventricular conduction abnormality (right bundle) and nonspecific ST-T wave abnormality.- Personally Reviewed  Physical Exam  Intubated and motionless Family member at bedside GEN: No acute distress.   Neck: No JVD Cardiac: RRR, no murmurs, rubs, or gallops.  Respiratory: Clear to auscultation bilaterally.  Mechanical ventilator sounds Neuro:   Unresponsive   Labs    High Sensitivity Troponin:   Recent Labs  Lab 10/05/2018 2108 10/24/2018 2317 10/05/18 0328 10/05/18 0601 10/06/18 1324  TROPONINIHS 5 89* 699* 778* 144*      Chemistry Recent Labs  Lab 10/05/18 0341  10/06/18 0442  10/08/18 0516 10/08/18 2232 10/09/18 0948 10/10/18 0427  NA  --    < >  --    < > 147* 149* 151* 155*  K  --    < >  --    < > 3.8 3.4* 3.6 4.0  CL  --    < >  --    < > 110  --  114* 119*  CO2  --    < >  --    < > 25  --  24 24  GLUCOSE  --    < >  --    < >  95  --  119* 109*  BUN  --    < >  --    < > 21*  --  28* 35*  CREATININE  --    < >  --    < > 1.39*  --  1.22 1.19  CALCIUM  --    < >  --    < > 9.1  --  10.0 9.9  PROT 6.8  --  7.3  --  7.2  --   --   --   ALBUMIN 3.9  --  3.6  --  3.3*  --   --   --   AST 380*  --  111*  --  100*  --   --   --   ALT 251*  --  183*  --  86*  --   --   --   ALKPHOS 97  --  67  --  87  --   --   --   BILITOT 1.1  --  1.4*  --  1.2  --   --   --   GFRNONAA  --    < >  --    < > >60  --  >60 >60  GFRAA  --    < >  --    < > >60  --  >60 >60  ANIONGAP  --    < >  --    < > 12  --  13 12   < > = values in this interval not displayed.     Hematology Recent Labs  Lab 10/08/18 0516 10/08/18 2232 10/09/18 0948 10/10/18 0427  WBC 30.5*  --  21.5* 21.6*  RBC 4.36  --  4.49 4.32  HGB 13.7 12.9* 13.9 13.5  HCT 40.5 38.0* 42.9 42.1  MCV 92.9  --  95.5 97.5  MCH 31.4  --  31.0 31.3  MCHC 33.8  --  32.4 32.1  RDW 15.2  --  15.9* 16.2*  PLT 233  --  273 267    BNPNo results for input(s): BNP, PROBNP in  the last 168 hours.   DDimer No results for input(s): DDIMER in the last 168 hours.   Radiology    Dg Chest Port 1 View  Result Date: 10/10/2018 CLINICAL DATA:  Intubation.  Respiratory failure. EXAM: PORTABLE CHEST 1 VIEW COMPARISON:  10/08/2018. FINDINGS: Endotracheal tube, NG tube, right IJ line stable position. Heart size normal. Partial clearing of right base infiltrate. New left base mild infiltrate. No pleural effusion or pneumothorax. Right costophrenic angle incompletely imaged. No acute bony abnormality identified. IMPRESSION: 1.  Lines and tubes stable position. 2. Partial clearing of right base infiltrate with mild residual. New left base mild infiltrate. Electronically Signed   By: Marcello Moores  Register   On: 10/10/2018 05:46   Dg Chest Port 1 View  Result Date: 10/08/2018 CLINICAL DATA:  Central line placement. EXAM: PORTABLE CHEST 1 VIEW COMPARISON:  10/08/2018 at 5:45 a.m. FINDINGS: Enteric tube, endotracheal tube unchanged. Right IJ central venous catheter with tip over the SVC. Lungs are adequately inflated with persistent airspace opacification over the right infrahilar region/medial right base. No effusion. No pneumothorax. Cardiomediastinal silhouette and remainder of the exam is unchanged. IMPRESSION: Persistent airspace process over the medial right base/infrahilar region. Tubes and lines as described. Electronically Signed   By: Marin Olp M.D.   On: 10/08/2018 13:48    Cardiac Studies   2D Doppler echocardiogram 10/05/2018 IMPRESSIONS  1. The left ventricle has severely reduced systolic function, with an ejection fraction of 20-25%. The cavity size was normal. There is mild concentric left ventricular hypertrophy. Left ventricular diastolic Doppler parameters are consistent with  pseudonormalization. Left ventricular diffuse hypokinesis.  2. The right ventricle has mildly reduced systolic function. The cavity was mildly enlarged. There is no increase in right  ventricular wall thickness. Right ventricular systolic pressure could not be assessed.  3. The tricuspid valve is not assessed.  4. The aortic valve was not well visualized. Aortic valve regurgitation was not assessed by color flow Doppler.  5. The aorta is not well visualized unless otherwise noted.  Patient Profile     28 y.o. male with polysubstance abuse admitted following out of hospital cardiac arrest- asystole.  Assessment & Plan    1. Bradycardia asystolic cardiac arrest with prolonged downtime before ROSC 2. Acute systolic heart failure: Systolic function pre-arrest is unknown. 3. Encephalopathy --> no real change noted today. 4. Severe hypertension: Continue carvedilol and hydralazine.  Blood pressure is relatively low today.  Will need to monitor hydralazine dosing.  [facility] will sign off.   Medication Recommendations: We are available to perform further cardiac evaluation if there is significant neurological recovery. Other recommendations (labs, testing, etc): No other recommendations at this time. Follow up as an outpatient: To be determined.       For questions or updates, please contact [place] Please consult [email or web address] for contact info under        Signed, Sinclair Grooms, MD  10/10/2018, 11:17 AM

## 2018-10-10 NOTE — Progress Notes (Signed)
Provided support and spiritual care to [name]' mom, Eric Archer.  We engaged in prayer over Eric Archer and his family.    Will continue to follow-up throughout [facility]' stay.

## 2018-10-10 NOTE — Plan of Care (Signed)
Pt was hypotensive during the beginning of the shift, [facility] MD ordered a second 500 cc bolus; blood pressure improved throughout the night. Pt's temperature changes drastically at any given moment, artic sun still in place. Pt has tolerated feedings well, pt had a couple bowel movements. Pt is safe, no falls throughout the night. Neurologically, pt does not move any extremity, does not responds to painful stimuli, pupils are very sluggish, and no cough noted when suctioning. Pt has copious amount of oral secretions. Pt has had a small amount of thick, tan secretions from ETT. No signs of pain or respiratory distress noted at this time. Bed is in lowest position. Will continue to monitor.  Problem: Clinical Measurements: Goal: Ability to maintain clinical measurements within normal limits will improve Outcome: Not Progressing Goal: Cardiovascular complication will be avoided Outcome: Not Progressing Problem: Nutrition: Goal: Adequate nutrition will be maintained Outcome: Progressing Problem: Elimination: Goal: Will not experience complications related to bowel motility Outcome: Progressing Problem: Safety: Goal: Ability to remain free from injury will improve Outcome: Progressing

## 2018-10-11 ENCOUNTER — Inpatient Hospital Stay (HOSPITAL_COMMUNITY): Payer: Medicaid Other

## 2018-10-11 DIAGNOSIS — J9601 Acute respiratory failure with hypoxia: Secondary | ICD-10-CM

## 2018-10-11 DIAGNOSIS — Z7189 Other specified counseling: Secondary | ICD-10-CM

## 2018-10-11 DIAGNOSIS — G931 Anoxic brain damage, not elsewhere classified: Secondary | ICD-10-CM

## 2018-10-11 DIAGNOSIS — Z515 Encounter for palliative care: Secondary | ICD-10-CM

## 2018-10-11 DIAGNOSIS — J9600 Acute respiratory failure, unspecified whether with hypoxia or hypercapnia: Secondary | ICD-10-CM

## 2018-10-11 LAB — BASIC METABOLIC PANEL
Anion gap: 11 (ref 5–15)
Anion gap: 11 (ref 5–15)
BUN: 42 mg/dL — ABNORMAL HIGH (ref 6–20)
BUN: 40 mg/dL — ABNORMAL HIGH (ref 6–20)
CO2: 21 mmol/L — ABNORMAL LOW (ref 22–32)
CO2: 23 mmol/L (ref 22–32)
Calcium: 9.7 mg/dL (ref 8.9–10.3)
Calcium: 9.7 mg/dL (ref 8.9–10.3)
Chloride: 119 mmol/L — ABNORMAL HIGH (ref 98–111)
Chloride: 125 mmol/L — ABNORMAL HIGH (ref 98–111)
Creatinine, Ser: 1.22 mg/dL (ref 0.61–1.24)
Creatinine, Ser: 1.42 mg/dL — ABNORMAL HIGH (ref 0.61–1.24)
GFR calc Af Amer: >60 mL/min (ref >60)
GFR calc Af Amer: >60 mL/min (ref >60)
GFR calc non Af Amer: >60 mL/min (ref >60)
GFR calc non Af Amer: >60 mL/min (ref >60)
Glucose, Bld: 127 mg/dL — ABNORMAL HIGH (ref 70–99)
Glucose, Bld: 133 mg/dL — ABNORMAL HIGH (ref 70–99)
Potassium: <2 mmol/L — CL (ref 3.5–5.1)
Potassium: 2.7 mmol/L — CL (ref 3.5–5.1)
Sodium: 151 mmol/L — ABNORMAL HIGH (ref 135–145)
Sodium: 159 mmol/L — ABNORMAL HIGH (ref 135–145)

## 2018-10-11 LAB — POCT I-STAT 7, (LYTES, BLD GAS, ICA,H+H)
Acid-base deficit: 2 mmol/L (ref 0.0–2.0)
Bicarbonate: 21.4 mmol/L (ref 20.0–28.0)
Calcium, Ion: 1.35 mmol/L (ref 1.15–1.40)
HCT: 37 % — ABNORMAL LOW (ref 39.0–52.0)
Hemoglobin: 12.6 g/dL — ABNORMAL LOW (ref 13.0–17.0)
O2 Saturation: 95 %
Potassium: <2 mmol/L — CL (ref 3.5–5.1)
Sodium: 159 mmol/L — ABNORMAL HIGH (ref 135–145)
TCO2: 22 mmol/L (ref 22–32)
pCO2 arterial: 32.3 mmHg (ref 32.0–48.0)
pH, Arterial: 7.429 (ref 7.350–7.450)
pO2, Arterial: 73 mmHg — ABNORMAL LOW (ref 83.0–108.0)

## 2018-10-11 LAB — CBC
HCT: 41 % (ref 39.0–52.0)
Hemoglobin: 13.8 g/dL (ref 13.0–17.0)
MCH: 31.7 pg (ref 26.0–34.0)
MCHC: 33.7 g/dL (ref 30.0–36.0)
MCV: 94 fL (ref 80.0–100.0)
Platelets: 296 10*3/uL (ref 150–400)
RBC: 4.36 MIL/uL (ref 4.22–5.81)
RDW: 16 % — ABNORMAL HIGH (ref 11.5–15.5)
WBC: 23.5 10*3/uL — ABNORMAL HIGH (ref 4.0–10.5)
nRBC: 0.6 % — ABNORMAL HIGH (ref 0.0–0.2)

## 2018-10-11 LAB — GLUCOSE, CAPILLARY
Glucose-Capillary: 101 mg/dL — ABNORMAL HIGH (ref 70–99)
Glucose-Capillary: 109 mg/dL — ABNORMAL HIGH (ref 70–99)
Glucose-Capillary: 121 mg/dL — ABNORMAL HIGH (ref 70–99)
Glucose-Capillary: 122 mg/dL — ABNORMAL HIGH (ref 70–99)
Glucose-Capillary: 134 mg/dL — ABNORMAL HIGH (ref 70–99)
Glucose-Capillary: 136 mg/dL — ABNORMAL HIGH (ref 70–99)
Glucose-Capillary: 143 mg/dL — ABNORMAL HIGH (ref 70–99)

## 2018-10-11 LAB — PHOSPHORUS
Phosphorus: 2 mg/dL — ABNORMAL LOW (ref 2.5–4.6)
Phosphorus: 4.7 mg/dL — ABNORMAL HIGH (ref 2.5–4.6)

## 2018-10-11 LAB — MAGNESIUM: Magnesium: 2.2 mg/dL (ref 1.7–2.4)

## 2018-10-11 MED ORDER — ACETAMINOPHEN 160 MG/5ML PO SOLN
325.0000 mg | Freq: Four times a day (QID) | ORAL | Status: DC | PRN
  Administered 2018-10-11 – 2018-10-12 (×4): 325 mg via ORAL
  Filled 2018-10-11 (×4): qty 20.3

## 2018-10-11 MED ORDER — POTASSIUM CHLORIDE 20 MEQ PO PACK
20.0000 meq | PACK | ORAL | Status: AC
  Administered 2018-10-11 (×2): 20 meq via ORAL
  Filled 2018-10-11 (×2): qty 1

## 2018-10-11 MED ORDER — POTASSIUM CHLORIDE 10 MEQ/50ML IV SOLN
10.0000 meq | INTRAVENOUS | Status: DC
  Administered 2018-10-11 (×2): 10 meq via INTRAVENOUS
  Filled 2018-10-11 (×3): qty 50

## 2018-10-11 MED ORDER — POTASSIUM CHLORIDE 20 MEQ PO PACK
40.0000 meq | PACK | Freq: Two times a day (BID) | ORAL | Status: AC
  Administered 2018-10-11 – 2018-10-13 (×5): 40 meq via ORAL
  Filled 2018-10-11 (×5): qty 2

## 2018-10-11 MED ORDER — SODIUM PHOSPHATES 45 MMOLE/15ML IV SOLN
20.0000 mmol | Freq: Once | INTRAVENOUS | Status: AC
  Administered 2018-10-11: 08:00:00 20 mmol via INTRAVENOUS
  Filled 2018-10-11: qty 6.67

## 2018-10-11 NOTE — Progress Notes (Signed)
CRITICAL VALUE ALERT  Critical Value: Potassium <2.0  Date & Time Notied:  10/11/2018 0528  Provider Notified: Merci RN to report to Dr. Oletta Darter  Orders Received/Actions taken: Waiting for orders

## 2018-10-11 NOTE — Progress Notes (Signed)
[facility] Progress Note Patient Name: Eric Archer DOB: 04/09/1990 MRN: [id number]   Date of Service  10/11/2018  HPI/Events of Note  Fever - Temp = 100.0 F and has been creeping up since TTM stopped. Patient on Zosyn. AST and ALT.  eICU Interventions  Ice pack PRN prior to other antipyretic modalities.      Intervention Category Major Interventions: Other:  Lysle Dingwall 10/11/2018, 1:33 AM

## 2018-10-11 NOTE — Progress Notes (Addendum)
NEUROLOGY PROGRESS NOTE  Subjective: Patient is intubated, triggers vent  Exam: Vitals:   10/11/18 0800 10/11/18 0900  BP: 118/74 115/77  Pulse:    Resp: (!) 33 (!) 41  Temp: 98.5 F (36.9 C)   SpO2: 97% 96%    Physical Exam   HEENT-  Normocephalic, no lesions, without obvious abnormality.  Normal external eye and conjunctiva.   Extremities- Warm, dry and intact Musculoskeletal-no joint tenderness, deformity or swelling Skin-warm and dry, no hyperpigmentation, vitiligo, or suspicious lesions    Neuro:  Mental Status: Patient does not respond to verbal stimuli.  Does not respond to deep sternal rub.  Does not follow commands.  No verbalizations noted.  Cranial Nerves: II: patient does not respond confrontation bilaterally,  III,IV,VI: doll's response absent bilaterally. pupils right 2 mm, left 2 mm,and nonreactive bilaterally, disconjugate V,VII: corneal reflex absent bilaterally  VIII: patient does not respond to verbal stimuli IX,X: gag reflex absent, XI: trapezius strength unable to test bilaterally XII: tongue strength unable to test Motor: Extremities flaccid throughout.  No spontaneous movement noted.  No purposeful movements noted. Sensory: Does not respond to noxious stimuli in any extremity. Deep Tendon Reflexes:  Absent throughout. Plantars: absent bilaterally Cerebellar: Unable to perform    Medications:  Scheduled: . carvedilol  6.25 mg Oral BID WC  . chlorhexidine gluconate (MEDLINE KIT)  15 mL Mouth Rinse BID  . Chlorhexidine Gluconate Cloth  6 each Topical Daily  . feeding supplement (PRO-STAT SUGAR FREE 64)  30 mL Per Tube BID  . folic acid  1 mg Intravenous Daily  . free water  200 mL Per Tube Q4H  . heparin  5,000 Units Subcutaneous Q8H  . mouth rinse  15 mL Mouth Rinse 10 times per day  . pantoprazole sodium  40 mg Per Tube Q1200  . potassium chloride  40 mEq Oral BID  . sodium chloride flush  10-40 mL Intracatheter Q12H  . thiamine  100  mg Intravenous Daily   Continuous: . sodium chloride Stopped (10/08/18 2109)  . dextrose Stopped (10/11/18 0700)  . feeding supplement (VITAL AF 1.2 CAL) 1,000 mL (10/10/18 1800)  . lactated ringers Stopped (10/08/18 1403)  . piperacillin-tazobactam (ZOSYN)  IV 3.375 g (10/11/18 2595)  . sodium phosphate  Dextrose 5% IVPB 43 mL/hr at 10/11/18 0800   GLO:[id number], sodium chloride flush  Pertinent Labs/Diagnostics: -Sodium 151 -Potassium less than 2 - BUN 42 -WBC 23.5   Mr Brain Wo Contrast  Result Date: 10/11/2018  IMPRESSION: Global anoxic injury with extensive gray matter involvement and downward central descent of the brain. Electronically Signed   By: Monte Fantasia M.D.   On: 10/11/2018 05:30    Etta Quill PA-C [facility] [phone]   Assessment: 28 year old male with past medical history of substance abuse, EtOH abuse status post witnessed cardiac arrest, [facility].  LTM showed initial burst suppression pattern.  After discontinuation of Nimbex EEG had some improvement with 2 to 5 Hz delta theta slowing. EEG was not reactive.  At this point no improvement in exam and severe anoxic injury to brain seen on MRI.   Impression:  Severe anoxic brain injury  Recommendations: -Family meeting with PCCM- PCCM has had long discussion with family - Palliative care consult   Dr. Karena Addison to make any changes to note  10/11/2018, 9:51 AM   NEUROHOSPITALIST ADDENDUM Performed a face to face diagnostic evaluation.   I have reviewed the contents of history and physical exam  as documented by PA/ARNP/Resident.  I have discussed and formulated the above plan as documented. Edits to the note have been made as needed.  Unfortunate 28 year old male with cardiac arrest, greater than 20 minutes to ROSC.  Underwent TTM. Patient is now day 5, has no pupillary response, minimal doll's response with disconjugate gaze.  No corneal reflex or  gag reflex.  No motor response.  MRI brain shows global anoxic injury with extensive gray matter involvement as well as downward descent of the brain.  Explained to family that he has a very poor prognosis and unlikely to have any meaningful recovery.  Spent 30 minutes discussing prognosis with family and they seem to voiced understanding.     Karena Addison Kara Mierzejewski MD [facility] [phone]   If 7pm to 7am, please call on call as listed on AMION.   The patient is neurologically critically ill due to severe anoxic brain injury and is at high risk of death. I spent greater than 35 minutes of neuro critical care time with this patient.  Time is independent of PA.  This includes time spent on examining the patient, reviewing MRI brain, reviewing vital signs and labs, discussion with critical care team and long family discussion regarding goals of care.  Appreciate assistance of palliative care team.

## 2018-10-11 NOTE — Progress Notes (Signed)
Pt was on wean 5/+5 when RT entered room. Pt was breathing in high 40s. Pt was switched back to previous mode.

## 2018-10-11 NOTE — Progress Notes (Signed)
Patient transported to MRI w/o complications.

## 2018-10-11 NOTE — Progress Notes (Signed)
NAME:  Eric Archer, MRN:  [id number], DOB:  [birth year], LOS: 7 ADMISSION DATE:  10/03/2018, CONSULTATION DATE:  09/29/2018 REFERRING MD:  Roderic Palau, CHIEF COMPLAINT:  Cardiac arrest  Brief History   28 year old male with history of substance abuse presented to witnessed cardiac arrest on 9/9--no bystander CPR performed.  EMS arrived and 5 to 8 minutes where patient was found to be in asystole and CPR was initiated. ROSC achieved 15 minutes later for a total time until resuscitation of 20 to 23 minutes.  History of present illness   This is a 28 year old male with past medical history significant for substance abuse presents the ED on 09/29/2018 via EMS status post cardiac arrest.  Cardiac arrest was witnessed by patient's girlfriend.  No bystander CPR was initiated at that time.  Is noted that took EMS about 5 to 8 minutes to arrive at the scene followed by about 15 minutes of CPR after which time ROSC was obtained.  During that time 1 mg Narcan and 1 mg epi.  Patient did not resume spontaneous breathing. At time of presentation is unclear which substances or abuse if any.  Father does note history of prescription drug abuse however he is unsure if patient is currently using any other drugs than marijuana. 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 Labs: Initial lactic acid 8.2.  UDS positive for THC and benzodiazepines.  EtOH 216.  COVID negative.  Initial chest x-ray showed diffuse GGO throughout.  Due to patient's ongoing hypoxia and increasing dilatory requirements, repeat chest x-ray was performed showing worsening airspace disease.  Patient was also noted to have a temperature of 102.4 F.  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  10/02/2018: CT head without contrast: unremarkable  10/05/18 EEG: profound diffuse encephalopathy--likely secondary to diffuse anoxic/hypoxic brain injury  10/11/2018 MRI shows global anoxic injury   Micro Data:  10/20/2018 blood culture: Negative 10/10/2018 respiratory culture>> Antimicrobials:  Zosyn 9/9 >> vanco 9/9 >>9/10  Interim history/subjective:  He is off hypothermia protocol MRI shows global anoxic injury Objective   Blood pressure 118/73, pulse 95, temperature 99.7 F (37.6 C), resp. rate (!) 41, height 5\' 10"  (1.778 m), weight 81.5 kg, SpO2 96 %. CVP:  [0 mmHg-10 mmHg] 10 mmHg  Vent Mode: PRVC FiO2 (%):  [40 %-50 %] 40 % Set Rate:  [20 bmp] 20 bmp Vt Set:  [440 mL] 440 mL PEEP:  [5 cmH20] 5 cmH20 Plateau Pressure:  [15 cmH20-25 cmH20] 21 cmH20   Intake/Output Summary (Last 24 hours) at 10/11/2018 0802 Last data filed at 10/11/2018 0600 Gross per 24 hour  Intake 3932.18 ml  Output 1305 ml  Net 2627.18 ml   Filed Weights   10/09/18 0448 10/10/18 0400 10/11/18 0500  Weight: 88.6 kg 83.8 kg 81.5 kg    Examination: General: Intubated, comatose HENT: ET tube and OG tube in place Lungs: Coarse bilateral breath sounds, no wheezes Cardiovascular: Regular, no edema, no murmur Abdomen: Soft, nondistended,No apparent tenderness, minimal bs Neuro: Received fentanyl and versed prior to my exam. Partially opens eyes, has an upward deviated  gaze.  Is not moving extremities, does not respond to pain or voice.  Pupils react to light.  Skin: No rash  Resolved Hospital Problem list   Acute renal injury.  AGMA  Assessment & Plan:  This is a 28 year old male status post witnessed cardiac arrest.  No CPR for about 5 to 8 minutes until EMS arrived.  Return of spontaneous  circulation after approximately 15 minutes and 1 mg epi 1 mg Narcan.  Respiratory distress with chest x-ray consistent with edema.  TTM done (33 degrees).   Acute anoxic brain injury with MRI on 10/11/2018 demonstrating global anoxic injury. Appreciate neurology's input Remains off sedation other than Precedex Remains tachypneic  Vent dependent respiratory failure status post arrest with anoxic injury. Vent bundle Precedex to keep respiratory rate and check Neurological status precludes extubation He will most likely need trach and PEG in the future.  Potassium phosphorus abnormalities 10/11/2018 replete potassium phosphorus Continue to monitor electrolytes and replete as needed  Hypernatremia Recent Labs  Lab 10/09/18 0948 10/10/18 0427 10/11/18 0337  NA 151* 155* 151*  Currently on D5W at 75 cc an hour Continue to monitor sodium  History of polysubstance abuse Continue thiamine folic acid Further substance abuse will be a moot point at this time.   Suspected aspiration Day 6/x of Zosyn Sputum culture 10/10/2018 no growth to date 10/01/2018 blood cultures negative   Best practice:  Diet: TF Pain/Anxiety/Delirium protocol (if indicated): requiring around the clock pain control and sedation, will start infusion. Daily sedation vacations.  VAP protocol (if indicated): Head of bed up 30 degrees DVT prophylaxis: Heparin 5000 units every 8 hours GI prophylaxis: Protonix Mobility: Bedrest Code Status: Full Family Communication: Mother updated intermittently Disposition: ICU  App cct 30 min  Brett CanalesSteve Adaira Centola ACNP [facility] PCCM Pager [phone] till 1 pm If no answer page [phone] 10/11/2018, 8:02 AM

## 2018-10-11 NOTE — Progress Notes (Signed)
Family meeting done today at 3pm with Dr. Lorraine Lax, palliative care NP, and this RN. Current condition and goals of care discussed. Family to meet with patient and told to take some time to make any further decisions. RN will continue to assist patient and family during this time.

## 2018-10-11 NOTE — Progress Notes (Signed)
[facility] Progress Note Patient Name: MARLENE PFLUGER DOB: 24-Sep-1990 MRN: [id number]   Date of Service  10/11/2018  HPI/Events of Note  K+ = < 2.0, PO4--- = 2.0 and Creatinine = 1.22.   eICU Interventions  Will replace K+ and PO4---.     Intervention Category Major Interventions: Electrolyte abnormality - evaluation and management  Sommer,Steven Eugene 10/11/2018, 5:45 AM

## 2018-10-11 NOTE — Consult Note (Signed)
Consultation Note Date: 10/11/2018   Patient Name: Eric Archer  DOB: 31-Jul-1990  MRN: [id number]  Age / Sex: 28 y.o., male  PCP: Patient, No Pcp Per Referring Physician: Kipp Brood, MD   Reason for Consultation: Establishing goals of care  HPI/Patient Profile: 28 y.o. male  with past medical history of polysubstance abuse admitted on 09/30/2018 following witnessed cardiac arrest. Per chart review, patient collapsed of girlfriend. Father reports patient was in his normal state of health earlier 9/9. EMS arrived and patient found to be in asystole. ACLS and narcan given with ROSC. Patient remains intubated, off sedation, with poor neurological examination. Neurology following. MRI 9/16 with global anoxic injury with extensive gray matter involvement and downward central descent of brain. Palliative medicine consultation for goals of care in patient with very poor prognosis.   Clinical Assessment and Goals of Care:  I have reviewed medical records, discussed with care team, and assessed the patient at bedside.   Met with Dr. Lorraine Lax and family in conference room, including patient's mother and father [name] and [place]), patient's sister Caryl Pina), and patient's fiance to discuss diagnosis, prognosis, GOC, EOL wishes, disposition and options.  I introduced Palliative Medicine as specialized medical care for people living with serious illness. It focuses on providing relief from the symptoms and stress of a serious illness. The goal is to improve quality of life for both the patient and the family.  Dr. Lorraine Lax discussed course of hospitalization including diagnoses, interventions, MRI results, and unfortunate poor prognosis due to severe anoxic brain injury.   The difference between aggressive medical intervention and comfort care was discussed, including extremely poor chance of neurological recovery and  futility of performing trach/peg in this clinical situation--with fear this would prolong [name]' suffering.  Answered questions and concerns for family. It is important for the patient's sister and girlfriend to visit with [name] at bedside this afternoon. They have not yet seen him in this condition.   [name] shares her thoughts that [name] would not wish to prolong life with poor chance for recovery or with meaningful quality of life. [name] shares how caring and compassionate [name] is and very put together, would not wish to depend on others to care for him. [name] has a big heart and took care of his grandmother at EOL (this past Spring). He has a two children (age 45 and 18) and one step-daughter.   [name] believes he is becoming weaker each day she visits him in the hospital.    [name] speaks of the importance of family further discussing plan of care--trach/peg versus shift to comfort with removal of ventilator to allow nature to take course. Ensured the plan would be for comfort and relief from suffering at EOL.    Emotional/spiritual support provided to family. Therapeutic listening as [name] shares stories and pictures of [name].   PMT contact information given.     SUMMARY OF RECOMMENDATIONS    Continue current plan of care and medical interventions. [place] discussion with neurology and family. Family understands medical  diagnoses and poor prognosis with very poor chance of meaningful recovery. Dr. Lorraine Lax reviewed MRI with family.   Family needs more time to discuss and consider options. Medical recommendation for comfort focused pathway including compassionate removal of ventilator to comfort in order to allow nature to take course. Mother does share her belief that [name] would not wish to prolong life in this current state and be dependent on others to care for him. Family to further discuss.   PMT provider will continue to follow and support family daily.   Code Status/Advance Care  Planning:  Full code  Symptom Management:   Per attending  Palliative Prophylaxis:   Aspiration, Delirium Protocol, Frequent Pain Assessment, Oral Care and Turn Reposition  Psycho-social/Spiritual:   Desire for further Chaplaincy support:yes  Additional Recommendations: Caregiving  Support/Resources and Compassionate Wean Education  Prognosis:   Poor prognosis   Discharge Planning: To Be Determined      Primary Diagnoses: Present on Admission:  Respiratory arrest [facility])  Cardiac arrest ([facility])   I have reviewed the medical record, interviewed the patient and family, and examined the patient. The following aspects are pertinent.  History reviewed. No pertinent past medical history. Social History   Socioeconomic History   Marital status: Single    Spouse name: Not on file   Number of children: Not on file   Years of education: Not on file   Highest education level: Not on file  Occupational History   Not on file  Social Needs   Financial resource strain: Not on file   Food insecurity    Worry: Not on file    Inability: Not on file   Transportation needs    Medical: Not on file    Non-medical: Not on file  Tobacco Use   Smoking status: Current Every Day Smoker    Packs/day: 0.50    Types: Cigarettes   Smokeless tobacco: Never Used  Substance and Sexual Activity   Alcohol use: Yes    Comment: weekly   Drug use: Yes    Types: Marijuana    Comment: opiates/benzos   Sexual activity: Yes    Birth control/protection: None  Lifestyle   Physical activity    Days per week: Not on file    Minutes per session: Not on file   Stress: Not on file  Relationships   Social connections    Talks on phone: Not on file    Gets together: Not on file    Attends religious service: Not on file    Active member of club or organization: Not on file    Attends meetings of clubs or organizations: Not on file    Relationship status: Not on file  Other  Topics Concern   Not on file  Social History Narrative   Not on file   History reviewed. No pertinent family history. Scheduled Meds:  carvedilol  6.25 mg Oral BID WC   chlorhexidine gluconate (MEDLINE KIT)  15 mL Mouth Rinse BID   Chlorhexidine Gluconate Cloth  6 each Topical Daily   feeding supplement (PRO-STAT SUGAR FREE 64)  30 mL Per Tube BID   folic acid  1 mg Intravenous Daily   free water  200 mL Per Tube Q4H   heparin  5,000 Units Subcutaneous Q8H   mouth rinse  15 mL Mouth Rinse 10 times per day   pantoprazole sodium  40 mg Per Tube Q1200   potassium chloride  40 mEq Oral BID   sodium chloride  flush  10-40 mL Intracatheter Q12H   thiamine  100 mg Intravenous Daily   Continuous Infusions:  sodium chloride Stopped (10/08/18 2109)   dextrose 75 mL/hr at 10/11/18 1600   feeding supplement (VITAL AF 1.2 CAL) 1,000 mL (10/11/18 1158)   lactated ringers Stopped (10/08/18 1403)   piperacillin-tazobactam (ZOSYN)  IV 3.375 g (10/11/18 1343)   PRN Meds:.hydrALAZINE, sodium chloride flush Medications Prior to Admission:  Prior to Admission medications   Not on File   No Known Allergies Review of Systems  Unable to perform ROS  Physical Exam Vitals signs and nursing note reviewed.  Constitutional:      Interventions: He is intubated.  HENT:     Head: Normocephalic and atraumatic.  Cardiovascular:     Rate and Rhythm: Tachycardia present.  Pulmonary:     Effort: No tachypnea, accessory muscle usage or respiratory distress. He is intubated.     Comments: Intubated/ventilated, FIO2 40% Neurological:     Comments: No response to pain/voice.    Vital Signs: BP 121/77 (BP Location: Right Arm)    Pulse (!) 105    Temp 99.5 F (37.5 C) (Oral)    Resp (!) 27    Ht 5' 10"  (1.778 m)    Wt 81.5 kg    SpO2 94%    BMI 25.78 kg/m  Pain Scale: CPOT   Pain Score: 0-No pain   SpO2: SpO2: 94 % O2 Device:SpO2: 94 % O2 Flow Rate: .   IO: Intake/output  summary:   Intake/Output Summary (Last 24 hours) at 10/11/2018 1632 Last data filed at 10/11/2018 1600 Gross per 24 hour  Intake 3742.13 ml  Output 2070 ml  Net 1672.13 ml    LBM: Last BM Date: 10/10/18 Baseline Weight: Weight: 90.7 kg Most recent weight: Weight: 81.5 kg     Palliative Assessment/Data: PPS 10%   Flowsheet Rows     Most Recent Value  Intake Tab  Referral Department  Critical care  Unit at Time of Referral  ICU  Palliative Care Primary Diagnosis  Neurology  Date Notified  10/11/18  Palliative Care Type  New Palliative care  Reason for referral  Clarify Goals of Care, End of Life Care Assistance  Date first seen by Palliative Care  10/11/18  # of days Palliative referral response time  0 Day(s)  Clinical Assessment  Palliative Performance Scale Score  10%  Psychosocial & Spiritual Assessment  Palliative Care Outcomes  Patient/Family meeting held?  Yes  Who was at the meeting?  mother, father, sister, fiance  Palliative Care Outcomes  Clarified goals of care, Provided end of life care assistance, Provided psychosocial or spiritual support, ACP counseling assistance      Time In/Out: 1330-1340, 1500-1600  Time Total: 35mn Greater than 50%  of this time was spent counseling and coordinating care related to the above assessment and plan.  Signed by:  MIhor Dow DNP, FNP-C Palliative Medicine Team  Phone: [phone]: [phone]  Please contact Palliative Medicine Team phone at [phone] questions and concerns.  For individual provider: See AShea Evans

## 2018-10-12 ENCOUNTER — Inpatient Hospital Stay (HOSPITAL_COMMUNITY): Payer: Medicaid Other

## 2018-10-12 LAB — CBC WITH DIFFERENTIAL/PLATELET
Abs Immature Granulocytes: 0.3 10*3/uL — ABNORMAL HIGH (ref 0.00–0.07)
Basophils Absolute: 0 10*3/uL (ref 0.0–0.1)
Basophils Relative: 0 %
Eosinophils Absolute: 0.5 10*3/uL (ref 0.0–0.5)
Eosinophils Relative: 2 %
HCT: 41.3 % (ref 39.0–52.0)
Hemoglobin: 14.4 g/dL (ref 13.0–17.0)
Lymphocytes Relative: 22 %
Lymphs Abs: 6 10*3/uL — ABNORMAL HIGH (ref 0.7–4.0)
MCH: 32.2 pg (ref 26.0–34.0)
MCHC: 34.9 g/dL (ref 30.0–36.0)
MCV: 92.4 fL (ref 80.0–100.0)
Monocytes Absolute: 2.7 10*3/uL — ABNORMAL HIGH (ref 0.1–1.0)
Monocytes Relative: 10 %
Neutro Abs: 17.7 10*3/uL — ABNORMAL HIGH (ref 1.7–7.7)
Neutrophils Relative %: 65 %
Platelets: 359 10*3/uL (ref 150–400)
Promyelocytes Relative: 1 %
RBC: 4.47 MIL/uL (ref 4.22–5.81)
RDW: 16.3 % — ABNORMAL HIGH (ref 11.5–15.5)
WBC: 27.3 10*3/uL — ABNORMAL HIGH (ref 4.0–10.5)
nRBC: 0.7 % — ABNORMAL HIGH (ref 0.0–0.2)
nRBC: 1 /100 WBC — ABNORMAL HIGH

## 2018-10-12 LAB — BASIC METABOLIC PANEL
Anion gap: 11 (ref 5–15)
BUN: 35 mg/dL — ABNORMAL HIGH (ref 6–20)
CO2: 25 mmol/L (ref 22–32)
Calcium: 9.5 mg/dL (ref 8.9–10.3)
Chloride: 124 mmol/L — ABNORMAL HIGH (ref 98–111)
Creatinine, Ser: 1.47 mg/dL — ABNORMAL HIGH (ref 0.61–1.24)
GFR calc Af Amer: >60 mL/min (ref >60)
GFR calc non Af Amer: >60 mL/min (ref >60)
Glucose, Bld: 121 mg/dL — ABNORMAL HIGH (ref 70–99)
Potassium: 4.3 mmol/L (ref 3.5–5.1)
Sodium: 160 mmol/L — ABNORMAL HIGH (ref 135–145)

## 2018-10-12 LAB — GLUCOSE, CAPILLARY
Glucose-Capillary: 94 mg/dL (ref 70–99)
Glucose-Capillary: 112 mg/dL — ABNORMAL HIGH (ref 70–99)

## 2018-10-12 LAB — MAGNESIUM: Magnesium: 2.8 mg/dL — ABNORMAL HIGH (ref 1.7–2.4)

## 2018-10-12 LAB — PHOSPHORUS: Phosphorus: 3.4 mg/dL (ref 2.5–4.6)

## 2018-10-12 MED ORDER — FREE WATER
300.0000 mL | Status: DC
  Administered 2018-10-12 – 2018-10-14 (×12): 300 mL

## 2018-10-12 MED ORDER — PRO-STAT SUGAR FREE PO LIQD
30.0000 mL | Freq: Every day | ORAL | Status: DC
  Administered 2018-10-13: 30 mL
  Filled 2018-10-12: qty 30

## 2018-10-12 MED ORDER — VITAL AF 1.2 CAL PO LIQD
1000.0000 mL | ORAL | Status: DC
  Administered 2018-10-12 – 2018-10-13 (×2): 1000 mL

## 2018-10-12 NOTE — Progress Notes (Signed)
Central line site leaking- RN unable to get labs this AM- Lab notified to collect AM labs. CXR ordered to verify CL placement- CL not being used at this time r/t leaking.

## 2018-10-12 NOTE — Progress Notes (Signed)
[place] Progress Note Patient Name: Eric Archer DOB: 07-04-1990 MRN: [id number]   Date of Service  10/12/2018  HPI/Events of Note  Fever to 101.1 F - Request for cooling blanket. Currently on Zosyn.   eICU Interventions  Will order a cooling blanket.      Intervention Category Major Interventions: Other:  Lysle Dingwall 10/12/2018, 5:40 AM

## 2018-10-12 NOTE — Progress Notes (Signed)
NAME:  Eric Archer, MRN:  [id number], DOB:  1990/09/04, LOS: 35 ADMISSION DATE:  10/24/2018, CONSULTATION DATE:  10/07/2018 REFERRING MD:  Roderic Palau, CHIEF COMPLAINT:  Cardiac arrest  Brief History   28 year old male with history of substance abuse presented to witnessed cardiac arrest on 9/9--no bystander CPR performed.  EMS arrived and 5 to 8 minutes where patient was found to be in asystole and CPR was initiated. ROSC achieved 15 minutes later for a total time until resuscitation of 20 to 23 minutes.  History of present illness   This is a 28 year old male with past medical history significant for substance abuse presents the ED on 09/30/2018 via EMS status post cardiac arrest.  Cardiac arrest was witnessed by patient's girlfriend.  No bystander CPR was initiated at that time.  Is noted that took EMS about 5 to 8 minutes to arrive at the scene followed by about 15 minutes of CPR after which time ROSC was obtained.  During that time 1 mg Narcan and 1 mg epi.  Patient did not resume spontaneous breathing. At time of presentation is unclear which substances or abuse if any.  Father does note history of prescription drug abuse however he is unsure if patient is currently using any other drugs than marijuana. 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 Labs: Initial lactic acid 8.2.  UDS positive for THC and benzodiazepines.  EtOH 216.  COVID negative.  Initial chest x-ray showed diffuse GGO throughout.  Due to patient's ongoing hypoxia and increasing dilatory requirements, repeat chest x-ray was performed showing worsening airspace disease.  Patient was also noted to have a temperature of 102.4 F.  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  10/09/2018: CT head without contrast: unremarkable  10/05/18 EEG: profound diffuse encephalopathy--likely secondary to diffuse anoxic/hypoxic brain injury  10/11/2018 MRI shows global anoxic injury   Micro Data:  10/12/2018 blood culture: Negative 10/10/2018 respiratory culture>> Antimicrobials:  Zosyn 9/9 >> vanco 9/9 >>9/10  Interim history/subjective:  He is off hypothermia protocol MRI shows global anoxic injury Objective   Blood pressure 122/76, pulse (!) 105, temperature (!) 101.1 F (38.4 C), temperature source Oral, resp. rate (!) 35, height 5\' 10"  (1.778 m), weight 81.9 kg, SpO2 97 %.    Vent Mode: PRVC FiO2 (%):  [40 %] 40 % Set Rate:  [20 bmp] 20 bmp Vt Set:  [440 mL] 440 mL PEEP:  [5 cmH20] 5 cmH20 Plateau Pressure:  [12 cmH20-26 cmH20] 20 cmH20   Intake/Output Summary (Last 24 hours) at 10/12/2018 0810 Last data filed at 10/12/2018 0500 Gross per 24 hour  Intake 3827.93 ml  Output 3320 ml  Net 507.93 ml   Filed Weights   10/10/18 0400 10/11/18 0500 10/12/18 0500  Weight: 83.8 kg 81.5 kg 81.9 kg    Examination: General: Young male no acute distress at rest HEENT: Endotracheal tube gastric tube in place.  Pupils equal react to light negative doll's eyes Neuro: Does not follow commands.  Does not respond to noxious stimulus.  Overbreathing the vent CV: Heart sounds regular regular rate rhythm PULM:  Decreased breath sounds in the bases GI: soft, bsx4 active  Extremities: warm/dry, negative edema  Skin: no rashes or lesions   Resolved Hospital Problem list   Acute renal injury.  AGMA  Assessment & Plan:  This is a 28 year old male status post witnessed cardiac arrest.  No CPR for about 5 to 8 minutes until EMS arrived.  Return of spontaneous circulation  after approximately 15 minutes and 1 mg epi 1 mg Narcan.  Respiratory distress with chest x-ray consistent with edema.  TTM done (33 degrees).  Severe anoxic brain damage by MRI.  Ongoing discussions family concerning goals of care.  Acute anoxic brain injury with MRI on 10/11/2018 demonstrating global anoxic injury. Appreciate neurology's input Palliative care is following Ongoing discussions with family  Vent dependent respiratory failure status post arrest with anoxic injury. Vent bundle Neurological status precludes any extubation unless it is one-way  Potassium phosphorus abnormalities Monitor replete electrolytes as needed  Hypernatremia Recent Labs  Lab 10/11/18 0837 10/11/18 1450 10/12/18 0546  NA 159* 159* 160*  Continue D5W at 75 cc an hour Increase free water for 200TO300 Continue to monitor sodium  History of polysubstance abuse Tinea thiamine and folic acid Anoxic injury is made possibility of further substance abuse a moot point   Suspected aspiration Day 7/x of Zosyn Continue to monitor culture data no positive 10/01/2018 blood cultures negative   Best practice:  Diet: TF Pain/Anxiety/Delirium protocol (if indicated): requiring around the clock pain control and sedation, will start infusion. Daily sedation vacations.  VAP protocol (if indicated): Head of bed up 30 degrees DVT prophylaxis: Heparin 5000 units every 8 hours GI prophylaxis: Protonix Mobility: Bedrest Code Status: Full Family Communication: Ongoing discussions with family along with input from palliative care and neurology. Disposition: ICU  App cct 30 min  Brett CanalesSteve Jhonnie Aliano ACNP [facility]ackLe [facility] PCCM Pager [phone] till 1 pm If no answer page [phone] 10/12/2018, 8:10 AM

## 2018-10-12 NOTE — Progress Notes (Addendum)
Pt continues to have high temperatures. RN has contacted portable equip. multiple times for a cooling blanket. Per portable eq.- there are no cooling blankets available at this time. Pt has ice packs/ room set to low temp/ tylenol prn until cooling blanket becomes available.   -@0015 -Cooling blanket applied . Pt rectal temp 103.  @ 0400 CRITICAL VALUE ALERT  Critical Value:  Na-167, Cl >130, K-3 Date & Time Notied:  10/13/18 0410 Provider Notified: CCM- Dr. Jimmy Footman Orders Received/Actions taken: see new orders

## 2018-10-12 NOTE — Progress Notes (Signed)
Spoke with RN and was made aware that Central line has already been DC'd from patient

## 2018-10-12 NOTE — Progress Notes (Signed)
Daily Progress Note   Patient Name: Eric Archer       Date: 10/12/2018 DOB: 1990/10/24  Age: 28 y.o. MRN#: [id number] Attending Physician: Eric Brood, MD Primary Care Physician: Patient, No Pcp Per Admit Date: 10/05/2018  Reason for Consultation/Follow-up: Establishing goals of care  Subjective:  Patient unresponsive to stimuli. Remains on full vent support. No s/s of discomfort this afternoon.   GOC:  F/u with mother, Eric Archer in waiting room. Further discussed diagnoses, poor prognosis, and plan of care/options.   Eric Archer shares that the family has jointly decided Eric Archer would not wish to live his life in this state of health, being dependent on others. The family agrees to compassionately remove him from the ventilator and allow "dignity" at EOL.   Eric Archer struggles with timing on the decision to remove ventilator. She does not want to bury Eric Archer on his birthday, which is Tuesday [birth year]. She contemplates removing ventilator Friday in order to bury him by Monday. Family is appropriately grieving and saddened by these events, but Eric Archer shares their faith that God has a better plan for Eric Archer and this is Scientist, forensic.   Eric Archer shares that her husband, Eric Archer Eric Archer' father) does not plan to visit the Archer anymore. He wants to remember good memories of Eric Archer. Eric Archer speaks of plan for Eric Archer' sister, Eric Archer to visit again this afternoon. Eric Archer is leaving decision up to [place]. Eric Archer plans to determine if Eric Archer and Eric Archer' fiance wish to be present during extubation.  Therapeutic listening and emotional/spiritual support provided to Eric Archer. Chaplain Eric Archer present during discussion. Eric Archer shares many stories of Eric Archer' kind and compassionate demeanor and love for his family especially his  children, and friends.   Explained process of compassionate extubation to comfort, ensuring comfort, dignity, and peace at EOL. Explained prognosis of possibly minutes-hours-days once ventilator is removed. Explained utilizing medications to ensure comfort.  Discussed code status with Eric Archer and what her wishes are regarding further resuscitation if Eric Archer' heart stopped prior to extubation. Eric Archer does not wish for Eric Archer to suffer any longer and does NOT wish for resuscitation to be performed if his heart stopped again. Eric Archer agrees with DNR code status. Chaplain, Eric Archer, witnessed this decision. Updated RN, attending, and neurology.   Eric Archer has PMT contact information and reassured of ongoing support inpatient.   Length  of Stay: 8  Current Medications: Scheduled Meds:  . carvedilol  6.25 mg Oral BID WC  . chlorhexidine gluconate (MEDLINE KIT)  15 mL Mouth Rinse BID  . Chlorhexidine Gluconate Cloth  6 each Topical Daily  . feeding supplement (PRO-STAT SUGAR FREE 64)  30 mL Per Tube BID  . folic acid  1 mg Intravenous Daily  . free water  300 mL Per Tube Q4H  . heparin  5,000 Units Subcutaneous Q8H  . mouth rinse  15 mL Mouth Rinse 10 times per day  . pantoprazole sodium  40 mg Per Tube Q1200  . potassium chloride  40 mEq Oral BID  . sodium chloride flush  10-40 mL Intracatheter Q12H  . thiamine  100 mg Intravenous Daily    Continuous Infusions: . sodium chloride Stopped (10/08/18 2109)  . dextrose 75 mL/hr at 10/12/18 1100  . feeding supplement (VITAL AF 1.2 CAL) 1,000 mL (10/12/18 0553)  . lactated ringers 10 mL/hr at 10/12/18 1100    PRN Meds: acetaminophen (TYLENOL) oral liquid 160 mg/5 mL, hydrALAZINE, sodium chloride flush  Physical Exam Vitals signs and nursing note reviewed.  Constitutional:      Interventions: He is intubated.  HENT:     Head: Normocephalic and atraumatic.  Cardiovascular:     Rate and Rhythm: Tachycardia present.  Pulmonary:     Effort: He is  intubated.     Comments: Intubated/full support vent. Skin:    General: Skin is warm and dry.  Neurological:     Comments: Unresponsive to all stimuli.             Vital Signs: BP 115/77   Pulse (!) 108   Temp (!) 102.9 F (39.4 C) (Oral)   Resp (!) 37   Ht 5' 10" (1.778 m)   Wt 81.9 kg   SpO2 93%   BMI 25.91 kg/m  SpO2: SpO2: 93 % O2 Device: O2 Device: Ventilator O2 Flow Rate:    Intake/output summary:   Intake/Output Summary (Last 24 hours) at 10/12/2018 1158 Last data filed at 10/12/2018 1100 Gross per 24 hour  Intake 4159.9 ml  Output 3720 ml  Net 439.9 ml   LBM: Last BM Date: 10/10/18 Baseline Weight: Weight: 90.7 kg Most recent weight: Weight: 81.9 kg       Palliative Assessment/Data: PPS 10%    Flowsheet Rows     Most Recent Value  Intake Tab  Referral Department  Critical care  Unit at Time of Referral  ICU  Palliative Care Primary Diagnosis  Neurology  Date Notified  10/11/18  Palliative Care Type  New Palliative care  Reason for referral  Clarify Goals of Care, End of Life Care Assistance  Date first seen by Palliative Care  10/11/18  # of days Palliative referral response time  0 Day(s)  Clinical Assessment  Palliative Performance Scale Score  10%  Psychosocial & Spiritual Assessment  Palliative Care Outcomes  Patient/Family meeting held?  Yes  Who was at the meeting?  mother, father, sister, fiance  Palliative Care Outcomes  Clarified goals of care, Provided end of life care assistance, Provided psychosocial or spiritual support, ACP counseling assistance      Patient Active Problem List   Diagnosis Date Noted  . Palliative care by specialist   . Goals of care, counseling/discussion   . Acute respiratory failure with hypoxia (HCC)   . Anoxic brain injury (HCC)   . Pressure injury of skin 10/09/2018  . Acute systolic heart failure (  HCC)   . Cardiac arrest (HCC) 10/05/2018  . ARDS (adult respiratory distress syndrome) (HCC)   .  Respiratory arrest (HCC) 10/20/2018  . Wrist fracture 06/12/2012  . 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 Distal radius fracture 05/01/2012    Palliative Care Assessment & Plan   Patient Profile: 27 y.o. male  with past medical history of polysubstance abuse admitted on 10/02/2018 following witnessed cardiac arrest. Per chart review, patient collapsed of girlfriend. Father reports patient was in his normal state of health earlier 9/9. EMS arrived and patient found to be in asystole. ACLS and narcan given with ROSC. Patient remains intubated, off sedation, with poor neurological examination. Neurology following. MRI 9/16 with global anoxic injury with extensive gray matter involvement and downward central descent of brain. Palliative medicine consultation for goals of care in patient with very poor prognosis.   Assessment: Witnessed cardiac arrest with ROSC Acute anoxic brain injury with MRI 9/16 demonstrating global anoxic injury Ventilator dependent respiratory failure Hx of polysubstance abuse  Recommendations/Plan:  Family consensus that Chris would not wish to live in this current state, dependent of others. Family consensus for compassionate extubation to allow comfort and dignity at EOL.   Mother considering timing of compassionate extubation. Father does not wish to be present and defers decision to mother. Mother to determine if daughter and fiance wish to be present for extubation. Mother does not want to bury Chris on his birthday, [birth year].  Mother has made decision for DNR code status in the event that Chris' heart stops prior to compassionate extubation.   PMT will continue to follow and support family.     Code Status: DNR   Code Status Orders  (From admission, onward)         Start     Ordered   10/05/18 0218  Full code  Continuous     10/05/18 0220        Code Status History    This patient has a current code status but no historical code status.   Advance Care  Planning Activity       Prognosis:   Poor prognosis likely hours if not less once compassionately extubated.  Discharge Planning:  Anticipated Archer Death once timing of compassionate extubation is determined  Care plan was discussed with mother, RN, chaplain, Dr. Agarwala/Steve, Dr. Aroor updated via secure epic chat  Thank you for allowing the Palliative Medicine Team to assist in the care of this patient.   Time In: 1100 Time Out: 1215 Total Time 75 Prolonged Time Billed yes      Greater than 50%  of this time was spent counseling and coordinating care related to the above assessment and plan.  Megan Mason, DNP, FNP-C Palliative Medicine Team  Phone: [phone] Fax: [phone]  Please contact Palliative Medicine Team phone at [phone] for questions and concerns.      

## 2018-10-12 NOTE — Progress Notes (Signed)
Nutrition Follow-up  DOCUMENTATION CODES:   Not applicable  INTERVENTION:   Increase tube feeding:  -Vital AF 1.2 @ 70 ml/hr via OGT -Decrease 30 ml Prostat to once daily -Free water flushes 300 Q4 hours- per CCM  Provides: 2116 kcals, 141 grams protein, 1362 ml free water (3162 ml total water). Meets 98% kcal needs and 100% of protein needs.   NUTRITION DIAGNOSIS:   Inadequate oral intake related to inability to eat as evidenced by NPO status.  Ongoing  GOAL:   Patient will meet greater than or equal to 90% of their needs  Addressed via TF  MONITOR:   Vent status, Labs, Weight trends, I & O's  REASON FOR ASSESSMENT:   Ventilator    ASSESSMENT:   27-year-old male who presented to the ED on 9/09 after a witnessed episode of choking. Pt with cardiac and respiratory arrest, CPR initiated and ROSC obtained. Pt required intubation. PMH of tobacco/EtOH/substance abuse. TTM 33 degrees initiated.   Pt discussed during ICU rounds and with RN.   Off hypothermia protocol. MRI consistent with anoxic brain injury. Hypernatremia persists. Palliative met with family-plan to continue current medical interventions with further GOC discussions. RD to make TF adjustments.   Admission weight: 86.3 kg Current weight: 81.9 kg   Patient remains intubated on ventilator support MV: 17.5 L/min Temp (24hrs), Avg:100.8 F (38.2 C), Min:99.7 F (37.6 C), Max:102.9 F (39.4 C)   I/O: +3,434 ml since admit UOP: 3.645 ml x 24 hrs   Drips: D5 @ 75 ml/hr Medications: folic acid, 40 mEq KCl TID, thiamine Labs: Na 160 (H) Mg 2.8 (H)  Diet Order:   Diet Order    None      EDUCATION NEEDS:   No education needs have been identified at this time  Skin:  Skin Assessment: Skin Integrity Issues: Skin Integrity Issues:: Stage I Stage I: upper face from EEG lead  Last BM:  9/15  Height:   Ht Readings from Last 1 Encounters:  10/22/2018 5' 10" (1.778 m)    Weight:   Wt Readings  from Last 1 Encounters:  10/12/18 81.9 kg    Ideal Body Weight:  75.5 kg  BMI:  Body mass index is 25.91 kg/m.  Estimated Nutritional Needs:   Kcal:  2158 kcal  Protein:  125-145 grams  Fluid:  >/= 2 L/day   Carly Madigan RD, LDN Clinical Nutrition Pager # - [phone]  

## 2018-10-12 NOTE — Progress Notes (Signed)
Spent time speaking with [name]' mom, Mrs. [name] and NP.  Mrs. [name] shared with [place] memories about [name] and not wanting her son to transition on his birthday, [birth year]. 22[birth year].  I offered spiritual presence, support and prayer.  I will continue to follow-up with family.

## 2018-10-13 ENCOUNTER — Inpatient Hospital Stay (HOSPITAL_COMMUNITY): Payer: Medicaid Other

## 2018-10-13 LAB — BASIC METABOLIC PANEL
BUN: 33 mg/dL — ABNORMAL HIGH (ref 6–20)
CO2: 27 mmol/L (ref 22–32)
Calcium: 10.2 mg/dL (ref 8.9–10.3)
Chloride: >130 mmol/L (ref 98–111)
Creatinine, Ser: 1.67 mg/dL — ABNORMAL HIGH (ref 0.61–1.24)
GFR calc Af Amer: >60 mL/min (ref >60)
GFR calc non Af Amer: 54 mL/min — ABNORMAL LOW (ref >60)
Glucose, Bld: 141 mg/dL — ABNORMAL HIGH (ref 70–99)
Potassium: 3 mmol/L — ABNORMAL LOW (ref 3.5–5.1)
Sodium: 167 mmol/L (ref 135–145)

## 2018-10-13 LAB — CBC WITH DIFFERENTIAL/PLATELET
Abs Immature Granulocytes: 0.56 10*3/uL — ABNORMAL HIGH (ref 0.00–0.07)
Basophils Absolute: 0.1 10*3/uL (ref 0.0–0.1)
Basophils Relative: 0 %
Eosinophils Absolute: 0.2 10*3/uL (ref 0.0–0.5)
Eosinophils Relative: 1 %
HCT: 44.8 % (ref 39.0–52.0)
Hemoglobin: 14.8 g/dL (ref 13.0–17.0)
Immature Granulocytes: 1 %
Lymphocytes Relative: 17 %
Lymphs Abs: 6.6 10*3/uL — ABNORMAL HIGH (ref 0.7–4.0)
MCH: 32 pg (ref 26.0–34.0)
MCHC: 33 g/dL (ref 30.0–36.0)
MCV: 96.8 fL (ref 80.0–100.0)
Monocytes Absolute: 2.4 10*3/uL — ABNORMAL HIGH (ref 0.1–1.0)
Monocytes Relative: 6 %
Neutro Abs: 28.9 10*3/uL — ABNORMAL HIGH (ref 1.7–7.7)
Neutrophils Relative %: 75 %
Platelets: 384 10*3/uL (ref 150–400)
RBC: 4.63 MIL/uL (ref 4.22–5.81)
RDW: 17.1 % — ABNORMAL HIGH (ref 11.5–15.5)
WBC: 38.8 10*3/uL — ABNORMAL HIGH (ref 4.0–10.5)
nRBC: 0.7 % — ABNORMAL HIGH (ref 0.0–0.2)

## 2018-10-13 LAB — OSMOLALITY, URINE
Osmolality, Ur: 312 mOsm/kg (ref 300–900)
Osmolality, Ur: 260 mOsm/kg — ABNORMAL LOW (ref 300–900)

## 2018-10-13 LAB — C DIFFICILE QUICK SCREEN W PCR REFLEX
C Diff antigen: POSITIVE — AB
C Diff interpretation: DETECTED
C Diff toxin: POSITIVE — AB

## 2018-10-13 LAB — GLUCOSE, CAPILLARY
Glucose-Capillary: 109 mg/dL — ABNORMAL HIGH (ref 70–99)
Glucose-Capillary: 115 mg/dL — ABNORMAL HIGH (ref 70–99)
Glucose-Capillary: 122 mg/dL — ABNORMAL HIGH (ref 70–99)
Glucose-Capillary: 128 mg/dL — ABNORMAL HIGH (ref 70–99)
Glucose-Capillary: 131 mg/dL — ABNORMAL HIGH (ref 70–99)
Glucose-Capillary: 214 mg/dL — ABNORMAL HIGH (ref 70–99)

## 2018-10-13 LAB — MAGNESIUM: Magnesium: 3.2 mg/dL — ABNORMAL HIGH (ref 1.7–2.4)

## 2018-10-13 LAB — PHOSPHORUS: Phosphorus: 3.7 mg/dL (ref 2.5–4.6)

## 2018-10-13 MED ORDER — VITAL AF 1.2 CAL PO LIQD
1000.0000 mL | ORAL | Status: DC
  Administered 2018-10-13: 16:00:00 1000 mL

## 2018-10-13 MED ORDER — DEXTROSE 5 % IV SOLN
INTRAVENOUS | Status: DC
  Administered 2018-10-13 (×3): via INTRAVENOUS

## 2018-10-13 MED ORDER — FENTANYL CITRATE (PF) 100 MCG/2ML IJ SOLN
50.0000 ug | INTRAMUSCULAR | Status: DC | PRN
  Administered 2018-10-13: 100 ug via INTRAVENOUS
  Filled 2018-10-13: qty 2

## 2018-10-13 MED ORDER — DESMOPRESSIN ACETATE 4 MCG/ML IJ SOLN
2.0000 ug | Freq: Two times a day (BID) | INTRAMUSCULAR | Status: DC
  Filled 2018-10-13: qty 1

## 2018-10-13 MED ORDER — CHLORHEXIDINE GLUCONATE CLOTH 2 % EX PADS
6.0000 | MEDICATED_PAD | Freq: Every day | CUTANEOUS | Status: DC
  Administered 2018-10-13: 06:00:00 6 via TOPICAL

## 2018-10-13 MED ORDER — POTASSIUM CHLORIDE 20 MEQ/15ML (10%) PO SOLN
40.0000 meq | Freq: Once | ORAL | Status: AC
  Administered 2018-10-13: 09:00:00 40 meq
  Filled 2018-10-13: qty 30

## 2018-10-13 MED ORDER — MIDAZOLAM HCL 2 MG/2ML IJ SOLN: 0.5000 mg | INTRAMUSCULAR | Status: DC | PRN

## 2018-10-13 MED ORDER — DESMOPRESSIN ACETATE 4 MCG/ML IJ SOLN
2.0000 ug | Freq: Two times a day (BID) | INTRAMUSCULAR | Status: DC
  Administered 2018-10-13 (×2): 2 ug via INTRAVENOUS
  Filled 2018-10-13: qty 1

## 2018-10-13 NOTE — Progress Notes (Signed)
eLink Physician-Brief Progress Note Patient Name: Eric Archer DOB: Jun 08, 1990 MRN: [id number]   Date of Service  10/13/2018  HPI/Events of Note  Patient with polyuria along with hypernatremia and hyperchloremia despite D5W and free water.    eICU Interventions  Plan: Concern for DI Urine osm prior to administration of DDAVP and then post administration of DDAVP     Intervention Category Major Interventions: Other:  Bekah Igoe 10/13/2018, 4:19 AM

## 2018-10-13 NOTE — Progress Notes (Signed)
Daily Progress Note   Patient Name: Eric Archer       Date: 10/13/2018 DOB: 1990-05-01  Age: 28 y.o. MRN#: [id number] Attending Physician: Kipp Brood, MD Primary Care Physician: Patient, No Pcp Per Admit Date: 10/17/2018  Reason for Consultation/Follow-up: Establishing goals of care  Subjective:  Patient unresponsive to stimuli but appears comfortable without s/s of distress.  GOC:  Mother, [name] at bedside. She immediately speaks of her decision to remove ventilator on Sunday 9/20 at [place]' father and girlfriend do not want to be present. Patient's sister, Caryl Pina wishes to be here on Sunday for extubation.   [name] speaks of wanting dignity for Eric Archer and again that he would not wish to be dependent on others.   Discussed initiation of low-dose comfort medications if he shows signs of discomfort or distress. [name] is agreeable to this.   Explained ongoing palliative support. Emotional/spiritual support provided.   Length of Stay: 9  Current Medications: Scheduled Meds:  . carvedilol  6.25 mg Oral BID WC  . chlorhexidine gluconate (MEDLINE KIT)  15 mL Mouth Rinse BID  . Chlorhexidine Gluconate Cloth  6 each Topical Daily  . desmopressin  2 mcg Intravenous BID  . feeding supplement (PRO-STAT SUGAR FREE 64)  30 mL Per Tube Daily  . folic acid  1 mg Intravenous Daily  . free water  300 mL Per Tube Q4H  . heparin  5,000 Units Subcutaneous Q8H  . mouth rinse  15 mL Mouth Rinse 10 times per day  . pantoprazole sodium  40 mg Per Tube Q1200  . sodium chloride flush  10-40 mL Intracatheter Q12H  . thiamine  100 mg Intravenous Daily    Continuous Infusions: . sodium chloride Stopped (10/08/18 2109)  . dextrose 75 mL/hr at 10/13/18 0919  . feeding supplement (VITAL  AF 1.2 CAL) 1,000 mL (10/13/18 1201)  . lactated ringers 10 mL/hr at 10/13/18 0600    PRN Meds: acetaminophen (TYLENOL) oral liquid 160 mg/5 mL, hydrALAZINE, sodium chloride flush  Physical Exam Vitals signs and nursing note reviewed.  Constitutional:      Interventions: He is intubated.  HENT:     Head: Normocephalic and atraumatic.  Cardiovascular:     Rate and Rhythm: Tachycardia present.  Pulmonary:     Effort: He is intubated.  Comments: Intubated/full support vent. Skin:    General: Skin is warm and dry.  Neurological:     Comments: Unresponsive to all stimuli.             Vital Signs: BP 116/82   Pulse 88   Temp 98.1 F (36.7 C) (Other (Comment))   Resp (!) 25   Ht 5' 10"  (1.778 m)   Wt 82.6 kg   SpO2 98%   BMI 26.13 kg/m  SpO2: SpO2: 98 % O2 Device: O2 Device: Ventilator O2 Flow Rate:    Intake/output summary:   Intake/Output Summary (Last 24 hours) at 10/13/2018 1348 Last data filed at 10/13/2018 1201 Gross per 24 hour  Intake 3247.98 ml  Output 3575 ml  Net -327.02 ml   LBM: Last BM Date: 10/10/18 Baseline Weight: Weight: 90.7 kg Most recent weight: Weight: 82.6 kg       Palliative Assessment/Data: PPS 10%    Flowsheet Rows     Most Recent Value  Intake Tab  Referral Department  Critical care  Unit at Time of Referral  ICU  Palliative Care Primary Diagnosis  Neurology  Date Notified  10/11/18  Palliative Care Type  New Palliative care  Reason for referral  Clarify Goals of Care, End of Life Care Assistance  Date first seen by Palliative Care  10/11/18  # of days Palliative referral response time  0 Day(s)  Clinical Assessment  Palliative Performance Scale Score  10%  Psychosocial & Spiritual Assessment  Palliative Care Outcomes  Patient/Family meeting held?  Yes  Who was at the meeting?  mother, father, sister, fiance  Palliative Care Outcomes  Clarified goals of care, Provided end of life care assistance, Provided psychosocial or  spiritual support, ACP counseling assistance      Patient Active Problem List   Diagnosis Date Noted  . Palliative care by specialist   . Goals of care, counseling/discussion   . Acute respiratory failure ([place])   . Anoxic brain injury ([place])   . Pressure injury of skin 10/09/2018  . Acute systolic heart failure ([place])   . Cardiac arrest ([place]) 10/05/2018  . ARDS (adult respiratory distress syndrome) ([place])   . Respiratory arrest ([place]) 10/22/2018  . Wrist fracture 06/12/2012  . 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 Distal radius fracture 05/01/2012    Palliative Care Assessment & Plan   Patient Profile: 28 y.o. male  with past medical history of polysubstance abuse admitted on 09/30/2018 following witnessed cardiac arrest. Per chart review, patient collapsed of girlfriend. Father reports patient was in his normal state of health earlier 9/9. EMS arrived and patient found to be in asystole. ACLS and narcan given with ROSC. Patient remains intubated, off sedation, with poor neurological examination. Neurology following. MRI 9/16 with global anoxic injury with extensive gray matter involvement and downward central descent of brain. Palliative medicine consultation for goals of care in patient with very poor prognosis.   Assessment: Witnessed cardiac arrest with ROSC Acute anoxic brain injury with MRI 9/16 demonstrating global anoxic injury Ventilator dependent respiratory failure Hx of polysubstance abuse  Recommendations/Plan:  Family consensus that Eric Archer would not wish to live in this current state, dependent of others. Family consensus for compassionate extubation to allow comfort and dignity at EOL.   Mother has made decision for DNR code status in the event that Eric Archer' heart stops prior to compassionate extubation.   Mother has made decision for compassionate extubation to comfort on Sunday, 10/15/18 at 11am.   Mother agreeable to  start comfort medications as needed for discomfort or  distress.    Code Status: DNR   Code Status Orders  (From admission, onward)         Start     Ordered   10/05/18 0218  Full code  Continuous     10/05/18 0220        Code Status History    This patient has a current code status but no historical code status.   Advance Care Planning Activity       Prognosis:   Poor prognosis likely hours-days once compassionately extubated.  Discharge Planning:  Anticipated Hospital Death once timing of compassionately extubated.  Care plan was discussed with mother, RN, Dr. Lynetta Mare  Thank you for allowing the Palliative Medicine Team to assist in the care of this patient.   Time In: 1310 Time Out: 1345 Total Time 35 Prolonged Time Billed no      Greater than 50%  of this time was spent counseling and coordinating care related to the above assessment and plan.  Ihor Dow, DNP, FNP-C Palliative Medicine Team  Phone: [phone] Fax: [phone]  Please contact Palliative Medicine Team phone at [phone] for questions and concerns.

## 2018-10-13 NOTE — Progress Notes (Addendum)
NAME:  Eric Archer, MRN:  [id number], DOB:  1990/11/20, LOS: 9 ADMISSION DATE:  10/03/2018, CONSULTATION DATE:  10/25/2018 REFERRING MD:  Estell HarpinZammit, CHIEF COMPLAINT:  Cardiac arrest  Brief History   28 year old male with history of substance abuse presented to witnessed cardiac arrest on 9/9--no bystander CPR performed.  EMS arrived and 5 to 8 minutes where patient was found to be in asystole and CPR was initiated. ROSC achieved 15 minutes later for a total time until resuscitation of 20 to 23 minutes.  History of present illness   This is a 28 year old male with past medical history significant for substance abuse presents the ED on 09/27/2018 via EMS status post cardiac arrest.  Cardiac arrest was witnessed by patient's girlfriend.  No bystander CPR was initiated at that time.  Is noted that took EMS about 5 to 8 minutes to arrive at the scene followed by about 15 minutes of CPR after which time ROSC was obtained.  During that time 1 mg Narcan and 1 mg epi.  Patient did not resume spontaneous breathing. At time of presentation is unclear which substances or abuse if any.  Father does note history of prescription drug abuse however he is unsure if patient is currently using any other drugs than marijuana. 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 Labs: Initial lactic acid 8.2.  UDS positive for THC and benzodiazepines.  EtOH 216.  COVID negative.  Initial chest x-ray showed diffuse GGO throughout.  Due to patient's ongoing hypoxia and increasing dilatory requirements, repeat chest x-ray was performed showing worsening airspace disease.  Patient was also noted to have a temperature of 102.4 F.  Past Medical History  Substance abuse per father Splenectomy s/p trauma  Significant Hospital Events   OOH cardiac arrest 9/9 Full DNR Palliative care and family are discussing timing of extubation  Consults:  Neurology  cardiology  Procedures:  9/9-ETT>> 9/9- Central line>>  Significant Diagnostic Tests:  09/28/2018: UDS + THC, benzodiazepines  10/01/2018 CXR: hazy ground glass opacity bilaterallty consistent with ARDS 10/20/2018 repeat CXR: worsening diffuse bilateral airspace disease--worse on left.  10/20/2018: CT head without contrast: unremarkable  10/05/18 EEG: profound diffuse encephalopathy--likely secondary to diffuse anoxic/hypoxic brain injury  10/11/2018 MRI shows global anoxic injury   Micro Data:  10/13/2018 blood culture: Negative 10/10/2018 respiratory culture>> Antimicrobials:  Zosyn 9/9 >>9/18 vanco 9/9 >>9/10  Interim history/subjective:  He is off hypothermia protocol MRI shows global anoxic injury Objective   Blood pressure 116/83, pulse 98, temperature 98.6 F (37 C), resp. rate (!) 27, height 5\' 10"  (1.778 m), weight 82.6 kg, SpO2 92 %.    Vent Mode: PRVC FiO2 (%):  [40 %] 40 % Set Rate:  [20 bmp] 20 bmp Vt Set:  [440 mL] 440 mL PEEP:  [5 cmH20] 5 cmH20 Plateau Pressure:  [15 cmH20-21 cmH20] 21 cmH20   Intake/Output Summary (Last 24 hours) at 10/13/2018 0807 Last data filed at 10/13/2018 0600 Gross per 24 hour  Intake 4322.13 ml  Output 4425 ml  Net -102.87 ml   Filed Weights   10/11/18 0500 10/12/18 0500 10/13/18 0500  Weight: 81.5 kg 81.9 kg 82.6 kg    Examination: General: Well-nourished well-developed male HEENT: Endotracheal tube is in place gastric tube is in place Neuro: Unresponsive, negative doll's eyes, overbreathing the vent CV: s1s2  no m/r/g PULM: Coarse rhonchi GI: soft, bsx4 active  Extremities: warm/dry, negative edema  Skin:  no rashes or lesions    Resolved Hospital Problem list   Acute renal injury.  AGMA  Assessment & Plan:  This is a 28 year old male status post witnessed cardiac arrest.  No CPR for about 5 to 8 minutes until EMS arrived.  Return of spontaneous circulation after approximately 15 minutes and 1 mg epi 1 mg Narcan.  Respiratory  distress with chest x-ray consistent with edema.  TTM done (33 degrees).  Severe anoxic brain damage by MRI.  Ongoing discussions family concerning goals of care.  Acute anoxic brain injury with MRI on 10/11/2018 demonstrating global anoxic injury. Appreciate neurology's input Palliative care and family are trying decide appropriate time for one-way extubation  Vent dependent respiratory failure status post arrest with anoxic injury. 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 Hypernatremia Recent Labs  Lab 10/11/18 1450 10/12/18 0546 10/13/18 0240  NA 159* 160* 167*    Recent Labs  Lab 10/11/18 1450 10/12/18 0546 10/13/18 0240  K 2.7* 4.3 3.0*  Serum osmolarity 306 Urine osmolarity 312  Monitor replete electrolytes as needed DDAVP 2 mcg 2 times a day D5W at 75 cc an hour Replete potassium   Hypernatremia Recent Labs  Lab 10/11/18 1450 10/12/18 0546 10/13/18 0240  NA 159* 160* 167*  Continue D5W at 75 cc an hour Increase free water for 200TO300 Continue to monitor sodium  History of polysubstance abuse Continue thiamine and folic acid Severe anoxic injury makes further substance abuse moot point   Suspected aspiration Day 8/9 of Zosyn Blood cultures are negative.  Sputum cultures are pending Fever may be neurogenic in origin  stop Zosyn at this time.   Best practice:  Diet: TF Pain/Anxiety/Delirium protocol (if indicated): requiring around the clock pain control and sedation, will start infusion. Daily sedation vacations.  VAP protocol (if indicated): Head of bed up 30 degrees DVT prophylaxis: Heparin 5000 units every 8 hours GI prophylaxis: Protonix Mobility: Bedrest Code Status: Full Family Communication: Ongoing discussions with family along with input from palliative care and neurology.  Mother [name] does not want in the  past with a be buried on his birthday which is [birth year]. Disposition: ICU  App cct 30 min  Richardson Landry  ACNP Maryanna Shape PCCM Pager [phone] till 1 pm If no answer page [phone] 10/13/2018, 8:07 AM

## 2018-10-14 DIAGNOSIS — G931 Anoxic brain damage, not elsewhere classified: Secondary | ICD-10-CM

## 2018-10-14 DIAGNOSIS — J9600 Acute respiratory failure, unspecified whether with hypoxia or hypercapnia: Secondary | ICD-10-CM

## 2018-10-14 LAB — BASIC METABOLIC PANEL
Anion gap: 15 (ref 5–15)
BUN: 41 mg/dL — ABNORMAL HIGH (ref 6–20)
CO2: 21 mmol/L — ABNORMAL LOW (ref 22–32)
Calcium: 10 mg/dL (ref 8.9–10.3)
Chloride: 124 mmol/L — ABNORMAL HIGH (ref 98–111)
Creatinine, Ser: 1.46 mg/dL — ABNORMAL HIGH (ref 0.61–1.24)
GFR calc Af Amer: >60 mL/min (ref >60)
GFR calc non Af Amer: >60 mL/min (ref >60)
Glucose, Bld: 123 mg/dL — ABNORMAL HIGH (ref 70–99)
Potassium: 5.1 mmol/L (ref 3.5–5.1)
Sodium: 160 mmol/L — ABNORMAL HIGH (ref 135–145)

## 2018-10-14 LAB — CULTURE, RESPIRATORY W GRAM STAIN

## 2018-10-14 LAB — GLUCOSE, CAPILLARY
Glucose-Capillary: 118 mg/dL — ABNORMAL HIGH (ref 70–99)
Glucose-Capillary: 95 mg/dL (ref 70–99)

## 2018-10-14 MED ORDER — VANCOMYCIN 50 MG/ML ORAL SOLUTION
125.0000 mg | Freq: Four times a day (QID) | ORAL | Status: DC
  Filled 2018-10-14 (×3): qty 2.5

## 2018-10-14 MED ORDER — MORPHINE 100MG IN NS 100ML (1MG/ML) PREMIX INFUSION
1.0000 mg/h | INTRAVENOUS | Status: DC
  Administered 2018-10-14: 11:00:00 1 mg/h via INTRAVENOUS
  Filled 2018-10-14: qty 100

## 2018-10-14 MED ORDER — MIDAZOLAM HCL 2 MG/2ML IJ SOLN
1.0000 mg | INTRAMUSCULAR | Status: DC | PRN
  Administered 2018-10-14: 1 mg via INTRAVENOUS
  Filled 2018-10-14: qty 2

## 2018-10-14 MED ORDER — GLYCOPYRROLATE 0.2 MG/ML IJ SOLN
0.2000 mg | INTRAMUSCULAR | Status: DC | PRN
  Administered 2018-10-14: 0.2 mg via INTRAVENOUS
  Filled 2018-10-14: qty 1

## 2018-10-14 MED ORDER — SODIUM CHLORIDE 0.9 % IV BOLUS: 500.0000 mL | Freq: Once | INTRAVENOUS | Status: DC

## 2018-10-14 MED ORDER — MORPHINE BOLUS VIA INFUSION
2.0000 mg | INTRAVENOUS | Status: DC | PRN
  Administered 2018-10-14: 4 mg via INTRAVENOUS
  Filled 2018-10-14: qty 4

## 2018-10-26 NOTE — Progress Notes (Signed)
Pharmacy Antibiotic Note  Eric Archer is a 28 y.o. male admitted on 10/13/2018 with . Found to be cdiff positive 9/19. Pharmacy has been consulted for Cdiff treatment dosing.  Plan: Vancomycin 125mg  per tube QID x 14 days. Noted that pt may go to palliative care later today  Height: 5\' 10"  (177.8 cm) Weight: 185 lb 3 oz (84 kg) IBW/kg (Calculated) : 73  Temp (24hrs), Avg:100.3 F (37.9 C), Min:98.1 F (36.7 C), Max:102.4 F (39.1 C)  Recent Labs  Lab 10/09/18 0948 10/10/18 0427 10/11/18 0337 10/11/18 1450 10/12/18 0546 10/13/18 0240 11/08/2018 0308  WBC 21.5* 21.6* 23.5*  --  27.3* 38.8*  --   CREATININE 1.22 1.19 1.22 1.42* 1.47* 1.67* 1.46*    Estimated Creatinine Clearance: 77.1 mL/min (A) (by C-G formula based on SCr of 1.46 mg/dL (H)).    No Known Allergies    Thank you for allowing pharmacy to be a part of this patient's care.  Sherlon Handing, PharmD, BCPS November 08, 2018 4:53 AM

## 2018-10-26 NOTE — Progress Notes (Addendum)
Pt unable to maintain BP. CCM MD Dr. Jimmy Footman notified. See new orders.  CDS updated about changes in pt status. Pts Mother contacted regarding changes overnight. CDS Family support counselor notified about changes, plans to meet with mother this morning.

## 2018-10-26 NOTE — Progress Notes (Signed)
Daily Progress Note   Patient Name: Eric Archer       Date: 11/10/18 DOB: 1990/05/05  Age: 28 y.o. MRN#: [id number] Attending Physician: Rush Farmer, MD Primary Care Physician: Patient, No Pcp Per Admit Date: 10/03/2018  Reason for Consultation/Follow-up: Establishing goals of care  Subjective:  Patient unresponsive to stimuli but appears comfortable without s/s of distress.  GOC:  F/u with mother and sister [name] and Caryl Pina) at bedside. Discussed events overnight including clinical decline with worsening blood pressure. [name] jokes that Eric Archer never does things as scheduled. We discussed this as a sign that Eric Archer is ready, and declaring himself. Recommended compassionate extubation today in order to allow him a natural death and dignity at EOL. Ulla Gallo agree.   Discussed transition to comfort measures only including initiation of morphine infusion to ensure comfort. Also explained other prn medications for comfort. Educated on EOL expectations. [name] and Caryl Pina are prepared for anything to happen at any time once ventilator is removed.   Chaplain requested for compassionate extubation.   Stayed with family and nursing staff at bedside during extubation. Therapeutic listening, emotional support, and spiritual support and prayer given to mother and sister. Prior to extubation, morphine infusion initiated and bolus doses given. Eric Archer appeared very comfort prior to extubation. Remained at bedside post-extubation. Patient with no respiratory effort. Patient passed away quickly and peaceful. Pronounced along with 2 RN's at bedside.   Length of Stay: 10  Current Medications: Scheduled Meds:  . chlorhexidine gluconate (MEDLINE KIT)  15 mL Mouth Rinse BID  . Chlorhexidine  Gluconate Cloth  6 each Topical Daily  . mouth rinse  15 mL Mouth Rinse 10 times per day  . sodium chloride flush  10-40 mL Intracatheter Q12H    Continuous Infusions: . sodium chloride Stopped (10/08/18 2109)  . lactated ringers Stopped (10/13/18 2042)  . morphine    . sodium chloride      PRN Meds: acetaminophen (TYLENOL) oral liquid 160 mg/5 mL, fentaNYL (SUBLIMAZE) injection, glycopyrrolate, hydrALAZINE, midazolam, morphine, sodium chloride flush  Physical Exam Vitals signs and nursing note reviewed.  Constitutional:      Interventions: He is intubated.  HENT:     Head: Normocephalic and atraumatic.  Cardiovascular:     Rate and Rhythm: Tachycardia present.  Pulmonary:  Effort: He is intubated.     Comments: Intubated/full support vent. Skin:    General: Skin is warm and dry.  Neurological:     Comments: Unresponsive to all stimuli.             Vital Signs: BP (!) 50/32 (BP Location: Right Arm)   Pulse 94   Temp (!) 101.1 F (38.4 C) (Rectal)   Resp (!) 23   Ht 5' 10"  (1.778 m)   Wt 84 kg   SpO2 95%   BMI 26.57 kg/m  SpO2: SpO2: 95 % O2 Device: O2 Device: Ventilator O2 Flow Rate:    Intake/output summary:   Intake/Output Summary (Last 24 hours) at October 28, 2018 1014 Last data filed at 10-28-18 0600 Gross per 24 hour  Intake 2017.98 ml  Output 1050 ml  Net 967.98 ml   LBM: Last BM Date: 10-28-2018 Baseline Weight: Weight: 90.7 kg Most recent weight: Weight: 84 kg       Palliative Assessment/Data: PPS 10%    Flowsheet Rows     Most Recent Value  Intake Tab  Referral Department  Critical care  Unit at Time of Referral  ICU  Palliative Care Primary Diagnosis  Neurology  Date Notified  10/11/18  Palliative Care Type  New Palliative care  Reason for referral  Clarify Goals of Care, End of Life Care Assistance  Date first seen by Palliative Care  10/11/18  # of days Palliative referral response time  0 Day(s)  Clinical Assessment  Palliative  Performance Scale Score  10%  Psychosocial & Spiritual Assessment  Palliative Care Outcomes  Patient/Family meeting held?  Yes  Who was at the meeting?  mother, father, sister, fiance  Palliative Care Outcomes  Clarified goals of care, Provided end of life care assistance, Provided psychosocial or spiritual support, ACP counseling assistance      Patient Active Problem List   Diagnosis Date Noted  . Palliative care by specialist   . Goals of care, counseling/discussion   . Acute respiratory failure ([place])   . Anoxic brain injury ([place])   . Pressure injury of skin 10/09/2018  . Acute systolic heart failure ([place])   . Cardiac arrest ([place]) 10/05/2018  . ARDS (adult respiratory distress syndrome) ([place])   . Respiratory arrest ([place]) 09/30/2018  . Wrist fracture 06/12/2012  . 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 Distal radius fracture 05/01/2012    Palliative Care Assessment & Plan   Patient Profile: 28 y.o. male  with past medical history of polysubstance abuse admitted on 10/03/2018 following witnessed cardiac arrest. Per chart review, patient collapsed of girlfriend. Father reports patient was in his normal state of health earlier 9/9. EMS arrived and patient found to be in asystole. ACLS and narcan given with ROSC. Patient remains intubated, off sedation, with poor neurological examination. Neurology following. MRI 9/16 with global anoxic injury with extensive gray matter involvement and downward central descent of brain. Palliative medicine consultation for goals of care in patient with very poor prognosis.   Assessment: Witnessed cardiac arrest with ROSC Acute anoxic brain injury with MRI 9/16 demonstrating global anoxic injury Ventilator dependent respiratory failure Hx of polysubstance abuse  Recommendations/Plan:  Family consensus that Eric Archer would not wish to live in this current state, dependent of others. Family consensus for compassionate extubation to allow comfort and dignity at EOL.    Transition to comfort measures only.   Morphine gtt ordered and started. Comfort medications on MAR.   Stayed with family at bedside during extubation. Patient  with no respiratory effort followed extubation. Appeared comfortable and peaceful at EOL. Expired while NP at bedside.   Code Status: DNR   Code Status Orders  (From admission, onward)         Start     Ordered   10/05/18 0218  Full code  Continuous     10/05/18 0220        Code Status History    This patient has a current code status but no historical code status.   Advance Care Planning Activity       Discharge Planning:  Anticipated Hospital Death   Care plan was discussed with mother, sister, RN, chaplain, updated Dr. Nelda Marseille  Thank you for allowing the Palliative Medicine Team to assist in the care of this patient.   Time In: 0950- 1115- Time Out: [phone] Total Time 80 Prolonged Time Billed yes      Greater than 50%  of this time was spent counseling and coordinating care related to the above assessment and plan.  Ihor Dow, DNP, FNP-C Palliative Medicine Team  Phone: [phone] Fax: [phone]  Please contact Palliative Medicine Team phone at [phone] for questions and concerns.

## 2018-10-26 NOTE — Procedures (Signed)
Extubation Procedure Note  Patient Details:   Name: Eric Archer DOB: [birth year] MRN: [id number]   Airway Documentation:    Vent end date: 10/09/2018 Vent end time: 1151   Evaluation  O2 sats: stable throughout Complications: No apparent complications Patient did tolerate procedure well. Bilateral Breath Sounds: Rhonchi   No, pt could not speak post extubation.  Pt extubated to 2 l/m [place] for compassionate extubation.  Earney Navy 10/22/2018, 11:52 AM

## 2018-10-26 NOTE — Death Summary Note (Signed)
DEATH SUMMARY   Patient Details  Name: Eric Archer MRN: [id number] DOB: 1990/08/26  Admission/Discharge Information   Admit Date:  10/20/18  Date of Death: Date of Death: Oct 30, 2018  Time of Death: Time of Death: 10-Jun-1202  Length of Stay: 05-21-2022  Referring Physician: Patient, No Pcp Per   Reason(s) for Hospitalization  Asystolic cardiac arrest  Diagnoses  Preliminary cause of death:   Asystolic cardiac arrest Secondary Diagnoses (including complications and co-morbidities):  Active Problems:   Respiratory arrest (HCC)   Cardiac arrest (HCC)   ARDS (adult respiratory distress syndrome) (HCC)   Pressure injury of skin   Acute systolic heart failure (HCC)   Palliative care by specialist   Terminal care   Acute respiratory failure (HCC)   Anoxic brain injury [facility])   Brief Hospital Course (including significant findings, care, treatment, and services provided and events leading to death)  28 year old male with history of substance abuse presented to witnessed cardiac arrest on 9/9--no bystander CPR performed.  EMS arrived and 5 to 8 minutes where patient was found to be in asystole and CPR was initiated. ROSC achieved 15 minutes later for a total time until resuscitation of 20 to 23 minutes.  Discussed prognosis with family.  After discussion, decision was made to make patient a full DNR with escalation of care only for CDS and patient became a donor.      Pertinent Labs and Studies  Significant Diagnostic Studies Dg Abd 1 View  Result Date: 10/06/2018 CLINICAL DATA:  Hyperbilirubinemia EXAM: ABDOMEN - 1 VIEW COMPARISON:  None. FINDINGS: Scattered large and small bowel gas is noted. Gastric catheter is noted within the stomach. No bony abnormality is seen. No mass lesion is noted. IMPRESSION: No acute abnormality noted. Electronically Signed   By: Alcide Clever M.D.   On: 10/06/2018 14:22   Ct Head Wo Contrast  Result Date: 10/20/2018 CLINICAL DATA:  28 year old male  with altered mental status. EXAM: CT HEAD WITHOUT CONTRAST TECHNIQUE: Contiguous axial images were obtained from the base of the skull through the vertex without intravenous contrast. COMPARISON:  Head CT report dated 12/19/2016 FINDINGS: Brain: No evidence of acute infarction, hemorrhage, hydrocephalus, extra-axial collection or mass lesion/mass effect. Vascular: No hyperdense vessel or unexpected calcification. Skull: Normal. Negative for fracture or focal lesion. Sinuses/Orbits: Mild mucoperiosteal thickening of paranasal sinuses. No air-fluid level. The mastoid air cells are clear. Other: None IMPRESSION: Normal unenhanced CT of the brain. Electronically Signed   By: Elgie Collard M.D.   On: 2018-10-20 22:38   Mr Brain Wo Contrast  Result Date: 10/11/2018 CLINICAL DATA:  Encephalopathy. Prognostic a shin for anoxic brain injury. EXAM: MRI HEAD WITHOUT CONTRAST TECHNIQUE: Multiplanar, multiecho pulse sequences of the brain and surrounding structures were obtained without intravenous contrast. COMPARISON:  Head CT from 7 days ago FINDINGS: Brain: Generalized increased diffusion and T2 weighted signal within gray matter. There is also increased signal within the surface of the cerebellum and along posterior descending tracks in the brainstem. Diffuse effacement of sulci with downward central descent of the brain. No hemorrhage, hydrocephalus, or collection. Vascular: Flow voids are preserved Skull and upper cervical spine: Negative for marrow lesion Sinuses/Orbits: Intubation with patchy bilateral paranasal sinus and mastoid opacification. IMPRESSION: Global anoxic injury with extensive gray matter involvement and downward central descent of the brain. Electronically Signed   By: Marnee Spring M.D.   On: 10/11/2018 05:30   Dg Chest 1v Repeat Same Day  Result Date: October 20, 2018 CLINICAL DATA:  Continued  low O2 sats EXAM: CHEST - 1 VIEW SAME DAY COMPARISON:  10/06/2018 FINDINGS: Worsening airspace disease  throughout both lungs, left greater than right. Heart is normal size. No effusions. Support devices are stable. IMPRESSION: Worsening diffuse bilateral airspace disease, left greater than right. This could reflect infection or edema. Electronically Signed   By: Rolm Baptise M.D.   On: 10/18/2018 23:53   Dg Chest Port 1 View  Result Date: 10/13/2018 CLINICAL DATA:  Respiratory failure.  Ventilator patient. EXAM: PORTABLE CHEST 1 VIEW COMPARISON:  Radiographs 10/12/2018 and 10/10/2018.  CT 05/27/2012. FINDINGS: 0604 hours. The endotracheal and nasogastric tubes are unchanged. No definite neck catheter visualized. The heart size and mediastinal contours are stable. There are persistent band like opacities medially in both lower lobes, improved from yesterday. There is no new airspace disease, pneumothorax or significant pleural effusion. The bones appear unchanged. IMPRESSION: Interval improvement in bandlike lower lobe opacities bilaterally, likely due to improving atelectasis. Stable support system. Electronically Signed   By: Richardean Sale M.D.   On: 10/13/2018 08:59   Dg Chest Port 1 View  Result Date: 10/12/2018 CLINICAL DATA:  ETT, recheck central line placement EXAM: PORTABLE CHEST 1 VIEW COMPARISON:  10/10/2018 FINDINGS: There has been substantial interval retraction of a right neck vascular catheter, which now projects with tip in a peripheral location over the soft tissues of the right neck, significantly above the thoracic inlet. Endotracheal tube tip position is unchanged below the thoracic inlet. Esophagogastric tube with tip and side port below the diaphragm. Previously seen temperature probe has been removed. Bandlike atelectasis or consolidation of the retrocardiac left lung. No other airspace opacity. The heart and mediastinum are normal. IMPRESSION: 1. There has been substantial interval retraction of a right neck vascular catheter, which now projects with tip in a peripheral location over  the soft tissues of the right neck, significantly above the thoracic inlet. 2. Endotracheal tube tip position is unchanged below the thoracic inlet. Esophagogastric tube with tip and side port below the diaphragm. Previously seen temperature probe has been removed. 3. Bandlike atelectasis or consolidation of the retrocardiac left lung. No other airspace opacity. Electronically Signed   By: Eddie Candle M.D.   On: 10/12/2018 08:46   Dg Chest Port 1 View  Result Date: 10/10/2018 CLINICAL DATA:  Intubation.  Respiratory failure. EXAM: PORTABLE CHEST 1 VIEW COMPARISON:  10/08/2018. FINDINGS: Endotracheal tube, NG tube, right IJ line stable position. Heart size normal. Partial clearing of right base infiltrate. New left base mild infiltrate. No pleural effusion or pneumothorax. Right costophrenic angle incompletely imaged. No acute bony abnormality identified. IMPRESSION: 1.  Lines and tubes stable position. 2. Partial clearing of right base infiltrate with mild residual. New left base mild infiltrate. Electronically Signed   By: Marcello Moores  Register   On: 10/10/2018 05:46   Dg Chest Port 1 View  Result Date: 10/08/2018 CLINICAL DATA:  Central line placement. EXAM: PORTABLE CHEST 1 VIEW COMPARISON:  10/08/2018 at 5:45 a.m. FINDINGS: Enteric tube, endotracheal tube unchanged. Right IJ central venous catheter with tip over the SVC. Lungs are adequately inflated with persistent airspace opacification over the right infrahilar region/medial right base. No effusion. No pneumothorax. Cardiomediastinal silhouette and remainder of the exam is unchanged. IMPRESSION: Persistent airspace process over the medial right base/infrahilar region. Tubes and lines as described. Electronically Signed   By: Marin Olp M.D.   On: 10/08/2018 13:48   Dg Chest Port 1 View  Result Date: 10/08/2018 CLINICAL DATA:  28 year old male  with history of respiratory failure and hypoxia. EXAM: PORTABLE CHEST 1 VIEW COMPARISON:  Chest x-ray  10/06/2018. FINDINGS: An endotracheal tube is in place with tip 7.4 cm above the carina. There is a right-sided internal jugular central venous catheter with tip terminating in the proximal superior vena cava. Nasogastric tube extends into the stomach, curled retrograde with tip adjacent to the gastroesophageal junction. Lung volumes are normal. No consolidative airspace disease. No pleural effusions. No pneumothorax. No pulmonary nodule or mass noted. Pulmonary vasculature and the cardiomediastinal silhouette are within normal limits. IMPRESSION: 1. Support apparatus, as above. 2. Compared to prior study from 10/06/2018 the multifocal airspace disease seen on the prior examination has resolved. Electronically Signed   By: Trudie Reedaniel  Entrikin M.D.   On: 10/08/2018 08:10   Dg Chest Port 1 View  Result Date: 10/06/2018 CLINICAL DATA:  Respiratory failure EXAM: PORTABLE CHEST 1 VIEW COMPARISON:  Yesterday FINDINGS: Endotracheal tube tip in good position. Right IJ catheter with tip at the SVC. The orogastric tube reaches the stomach. Improving bilateral airspace disease in this patient with history of ARDS. Normal heart size IMPRESSION: 1. Unremarkable hardware positioning. 2. Improving bilateral airspace disease. Electronically Signed   By: Marnee SpringJonathon  Watts M.D.   On: 10/06/2018 08:36   Dg Chest Port 1 View  Result Date: 10/05/2018 CLINICAL DATA:  Central line placement EXAM: PORTABLE CHEST 1 VIEW COMPARISON:  Jun 06, 2018 FINDINGS: Support Apparatus: --Endotracheal tube: Tip at the level of the clavicular heads. --Enteric tube:Tip and sideport project over the stomach. --Catheter(s):Right internal jugular vein approach central venous catheter tip is at the lower SVC --Other: None There is confluent parahilar alveolar opacity, unchanged. No pleural effusion. IMPRESSION: 1. RIJ central venous catheter tip at the lover SVC. 2. Appropriate positioning of endotracheal tube. 3. Unchanged pulmonary edema. Electronically  Signed   By: Deatra RobinsonKevin  Herman M.D.   On: 10/05/2018 03:40   Dg Chest Portable 1 View  Result Date: 10/20/2018 CLINICAL DATA:  Tube placement EXAM: PORTABLE CHEST 1 VIEW COMPARISON:  None. FINDINGS: The tip of the endotracheal tube is 3 cm above the carina. NG tube is seen coursing below the diaphragm. There is hazy ground-glass airspace opacity seen throughout both lungs. IMPRESSION: Hazy ground glass opacity throughout both lungs could be due to ARDS. Endotracheal tube tip 3 cm above the carina. NG tube below the diaphragm. Electronically Signed   By: Jonna ClarkBindu  Avutu M.D.   On: Jun 06, 2018 21:29    Microbiology Recent Results (from the past 240 hour(s))  C difficile quick scan w PCR reflex     Status: Abnormal   Collection Time: 10/13/18  9:00 PM   Specimen: STOOL  Result Value Ref Range Status   C Diff antigen POSITIVE (A) NEGATIVE Final   C Diff toxin POSITIVE (A) NEGATIVE Final   C Diff interpretation Toxin producing C. difficile detected.  Final    Comment: CRITICAL RESULT CALLED TO, READ BACK BY AND VERIFIED WITH: Lindwood CokeJ YACOPINO RN [id number] 10/13/18 A BROWNING Performed at [facility], [address] [place], [place] [address]     Lab Basic Metabolic Panel: No results for input(s): NA, K, CL, CO2, GLUCOSE, BUN, CREATININE, CALCIUM, MG, PHOS in the last 168 hours. Liver Function Tests: No results for input(s): AST, ALT, ALKPHOS, BILITOT, PROT, ALBUMIN in the last 168 hours. No results for input(s): LIPASE, AMYLASE in the last 168 hours. No results for input(s): AMMONIA in the last 168 hours. CBC: No results for input(s): WBC, NEUTROABS, HGB, HCT,  MCV, PLT in the last 168 hours. Cardiac Enzymes: No results for input(s): CKTOTAL, CKMB, CKMBINDEX, TROPONINI in the last 168 hours. Sepsis Labs: No results for input(s): PROCALCITON, WBC, LATICACIDVEN in the last 168 hours.  Procedures/Operations     Koren Bound 10/23/2018, 1:13 PM

## 2018-10-26 NOTE — Progress Notes (Signed)
NAME:  Eric PatellaChristopher D Mccaffery, MRN:  [id number], DOB:  1990/07/30, LOS: 10 ADMISSION DATE:  10/05/2018, CONSULTATION DATE:  10/12/2018 REFERRING MD:  Estell HarpinZammit, CHIEF COMPLAINT:  Cardiac arrest  Brief History   28 year old male with history of substance abuse presented to witnessed cardiac arrest on 9/9--no bystander CPR performed.  EMS arrived and 5 to 8 minutes where patient was found to be in asystole and CPR was initiated. ROSC achieved 15 minutes later for a total time until resuscitation of 20 to 23 minutes.  History of present illness   This is a 28 year old male with past medical history significant for substance abuse presents the ED on 10/21/2018 via EMS status post cardiac arrest.  Cardiac arrest was witnessed by patient's girlfriend.  No bystander CPR was initiated at that time.  Is noted that took EMS about 5 to 8 minutes to arrive at the scene followed by about 15 minutes of CPR after which time ROSC was obtained.  During that time 1 mg Narcan and 1 mg epi.  Patient did not resume spontaneous breathing. At time of presentation is unclear which substances or abuse if any.  Father does note history of prescription drug abuse however he is unsure if patient is currently using any other drugs than marijuana. 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 Labs: Initial lactic acid 8.2.  UDS positive for THC and benzodiazepines.  EtOH 216.  COVID negative.  Initial chest x-ray showed diffuse GGO throughout.  Due to patient's ongoing hypoxia and increasing dilatory requirements, repeat chest x-ray was performed showing worsening airspace disease.  Patient was also noted to have a temperature of 102.4 F.  Past Medical History  Substance abuse per father Splenectomy s/p trauma  Significant Hospital Events   OOH cardiac arrest 9/9 Full DNR Palliative care and family are discussing timing of extubation  Consults:  Neurology  cardiology  Procedures:  9/9-ETT>> 9/9- Central line>>  Significant Diagnostic Tests:  10/08/2018: UDS + THC, benzodiazepines  09/27/2018 CXR: hazy ground glass opacity bilaterallty consistent with ARDS 10/13/2018 repeat CXR: worsening diffuse bilateral airspace disease--worse on left.  10/03/2018: CT head without contrast: unremarkable  10/05/18 EEG: profound diffuse encephalopathy--likely secondary to diffuse anoxic/hypoxic brain injury  10/11/2018 MRI shows global anoxic injury   Micro Data:  10/05/2018 blood culture: Negative 10/10/2018 respiratory culture>> Antimicrobials:  Zosyn 9/9 >>9/18 vanco 9/9 >>9/10  Interim history/subjective:  No events overnight, no new complaints  Objective   Blood pressure (!) 50/32, pulse 94, temperature (!) 101.1 F (38.4 C), temperature source Rectal, resp. rate (!) 23, height 5\' 10"  (1.778 m), weight 84 kg, SpO2 95 %.    Vent Mode: PRVC FiO2 (%):  [40 %-60 %] 60 % Set Rate:  [20 bmp] 20 bmp Vt Set:  [440 mL] 440 mL PEEP:  [5 cmH20-10 cmH20] 10 cmH20 Plateau Pressure:  [13 cmH20-22 cmH20] 17 cmH20   Intake/Output Summary (Last 24 hours) at 2018-03-13 1116 Last data filed at 2018-03-13 0600 Gross per 24 hour  Intake 1968.53 ml  Output 1050 ml  Net 918.53 ml   Filed Weights   10/12/18 0500 10/13/18 0500 08/05/2018 0341  Weight: 81.9 kg 82.6 kg 84 kg   Examination: General: Well-nourished well-developed male HEENT: [place]/AT, pupils non-reactive, negative dolls Neuro: Unresponsive, negative doll's eyes, overbreathing the vent CV: RRR, Nl S1/S2 and -M/R/G PULM: Coarse diffusely GI: Soft, NT, ND and +BS Extremities: warm/dry, negative edema  Skin:  no rashes or lesions  I reviewed CXR myself, ETT is in a good position  Resolved Hospital Problem list   Acute renal injury.  AGMA  Assessment & Plan:  This is a 28 year old male status post witnessed cardiac arrest.  No CPR for about 5 to 8 minutes until EMS arrived.  Return of spontaneous circulation  after approximately 15 minutes and 1 mg epi 1 mg Narcan.  Respiratory distress with chest x-ray consistent with edema.  TTM done (33 degrees).  Severe anoxic brain damage by MRI.  Ongoing discussions family concerning goals of care.  Acute anoxic brain injury with MRI on 10/11/2018 demonstrating global anoxic injury. Appreciate neurology's input Family would like to withdraw in AM Will d/c further weaning efforts  Vent dependent respiratory failure status post arrest with anoxic injury. 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 Hypernatremia Recent Labs  Lab 10/12/18 0546 10/13/18 0240 10/23/2018 0308  NA 160* 167* 160*   Recent Labs  Lab 10/12/18 0546 10/13/18 0240 10/18/2018 0308  K 4.3 3.0* 5.1  Serum osmolarity 306 Urine osmolarity 312  Monitor replete electrolytes as needed DDAVP 2 mcg 2 times a day D5W at 75 cc an hour Replete potassium  Hypernatremia Recent Labs  Lab 10/12/18 0546 10/13/18 0240 10/20/2018 0308  NA 160* 167* 160*  Continue D5W at 75 cc an hour Increase free water for 200TO300 Continue to monitor sodium  History of polysubstance abuse Continue thiamine and folic acid Severe anoxic injury makes further substance abuse moot point  Suspected aspiration Day 8/9 of Zosyn Blood cultures are negative.  Sputum cultures are pending Fever may be neurogenic in origin, will not advance care or culture  Spoke with family, DNR status, CDS coming in, maintain current care, no further blood draws, no further advance of care.  The patient is critically ill with multiple organ systems failure and requires high complexity decision making for assessment and support, frequent evaluation and titration of therapies, application of advanced monitoring technologies and extensive interpretation of multiple databases.    Critical Care Time devoted to patient care services described in this note is  32  Minutes. This time reflects time of care of this signee Dr Jennet Maduro. This critical care time does not reflect procedure time, or teaching time or supervisory time of PA/NP/Med student/Med Resident etc but could involve care discussion time.  Rush Farmer, M.D. [facility] Pulmonary/Critical Care Medicine. Pager: [phone]. After hours pager: [phone].

## 2018-10-26 NOTE — Progress Notes (Signed)
Chaplain visited after receiving page from palliative care team.  Chaplain support the family as care was withdrawn.  No follow-up is needed.  Brion Aliment Chaplain Resident For questions concerning this note please contact me by pager [phone]

## 2018-10-26 NOTE — Progress Notes (Signed)
[facility] Progress Note Patient Name: Eric Archer DOB: 13-Nov-1990 MRN: [id number]   Date of Service  10/03/2018  HPI/Events of Note  Nurse request for rectal tube prompted review of patient chart for possible cause of diarrhea.  Elevated WBC with fever.  C Diff positive.  eICU Interventions  Plan: Pharmacy consult.  Did place in request that family may withdraw support in AM.     Intervention Category Intermediate Interventions: Infection - evaluation and management  Karter Haire 10/13/2018, 4:39 AM

## 2018-10-26 NOTE — Plan of Care (Signed)
  Problem: Education: Goal: Knowledge of General Education information will improve Description Including pain rating scale, medication(s)/side effects and non-pharmacologic comfort measures Outcome: Not Progressing   Problem: Health Behavior/Discharge Planning: Goal: Ability to manage health-related needs will improve Outcome: Not Progressing   Problem: Clinical Measurements: Goal: Ability to maintain clinical measurements within normal limits will improve Outcome: Not Progressing Goal: Will remain free from infection Outcome: Not Progressing Goal: Diagnostic test results will improve Outcome: Not Progressing Goal: Respiratory complications will improve Outcome: Not Progressing Goal: Cardiovascular complication will be avoided Outcome: Not Progressing   Problem: Activity: Goal: Risk for activity intolerance will decrease Outcome: Not Progressing   Problem: Nutrition: Goal: Adequate nutrition will be maintained Outcome: Not Progressing   Problem: Coping: Goal: Level of anxiety will decrease Outcome: Not Progressing   Problem: Elimination: Goal: Will not experience complications related to bowel motility Outcome: Not Progressing Goal: Will not experience complications related to urinary retention Outcome: Not Progressing   Problem: Pain Managment: Goal: General experience of comfort will improve Outcome: Not Progressing   Problem: Safety: Goal: Ability to remain free from injury will improve Outcome: Not Progressing   Problem: Skin Integrity: Goal: Risk for impaired skin integrity will decrease Outcome: Not Progressing   Problem: Education: Goal: Ability to manage disease process will improve Outcome: Not Progressing   Problem: Cardiac: Goal: Ability to achieve and maintain adequate cardiopulmonary perfusion will improve Outcome: Not Progressing   Problem: Neurologic: Goal: Promote progressive neurologic recovery Outcome: Not Progressing   Problem: Skin  Integrity: Goal: Risk for impaired skin integrity will be minimized. Outcome: Not Progressing   

## 2018-10-26 DEATH — deceased

## 2020-07-13 IMAGING — DX DG CHEST 1V PORT
1 series · 1 of 1 positions shown · non-contrast
Comparison: 10/08/2018 at [DATE] a.m.

CLINICAL DATA: Central line placement.

EXAM:
PORTABLE CHEST 1 VIEW

[chest ap]
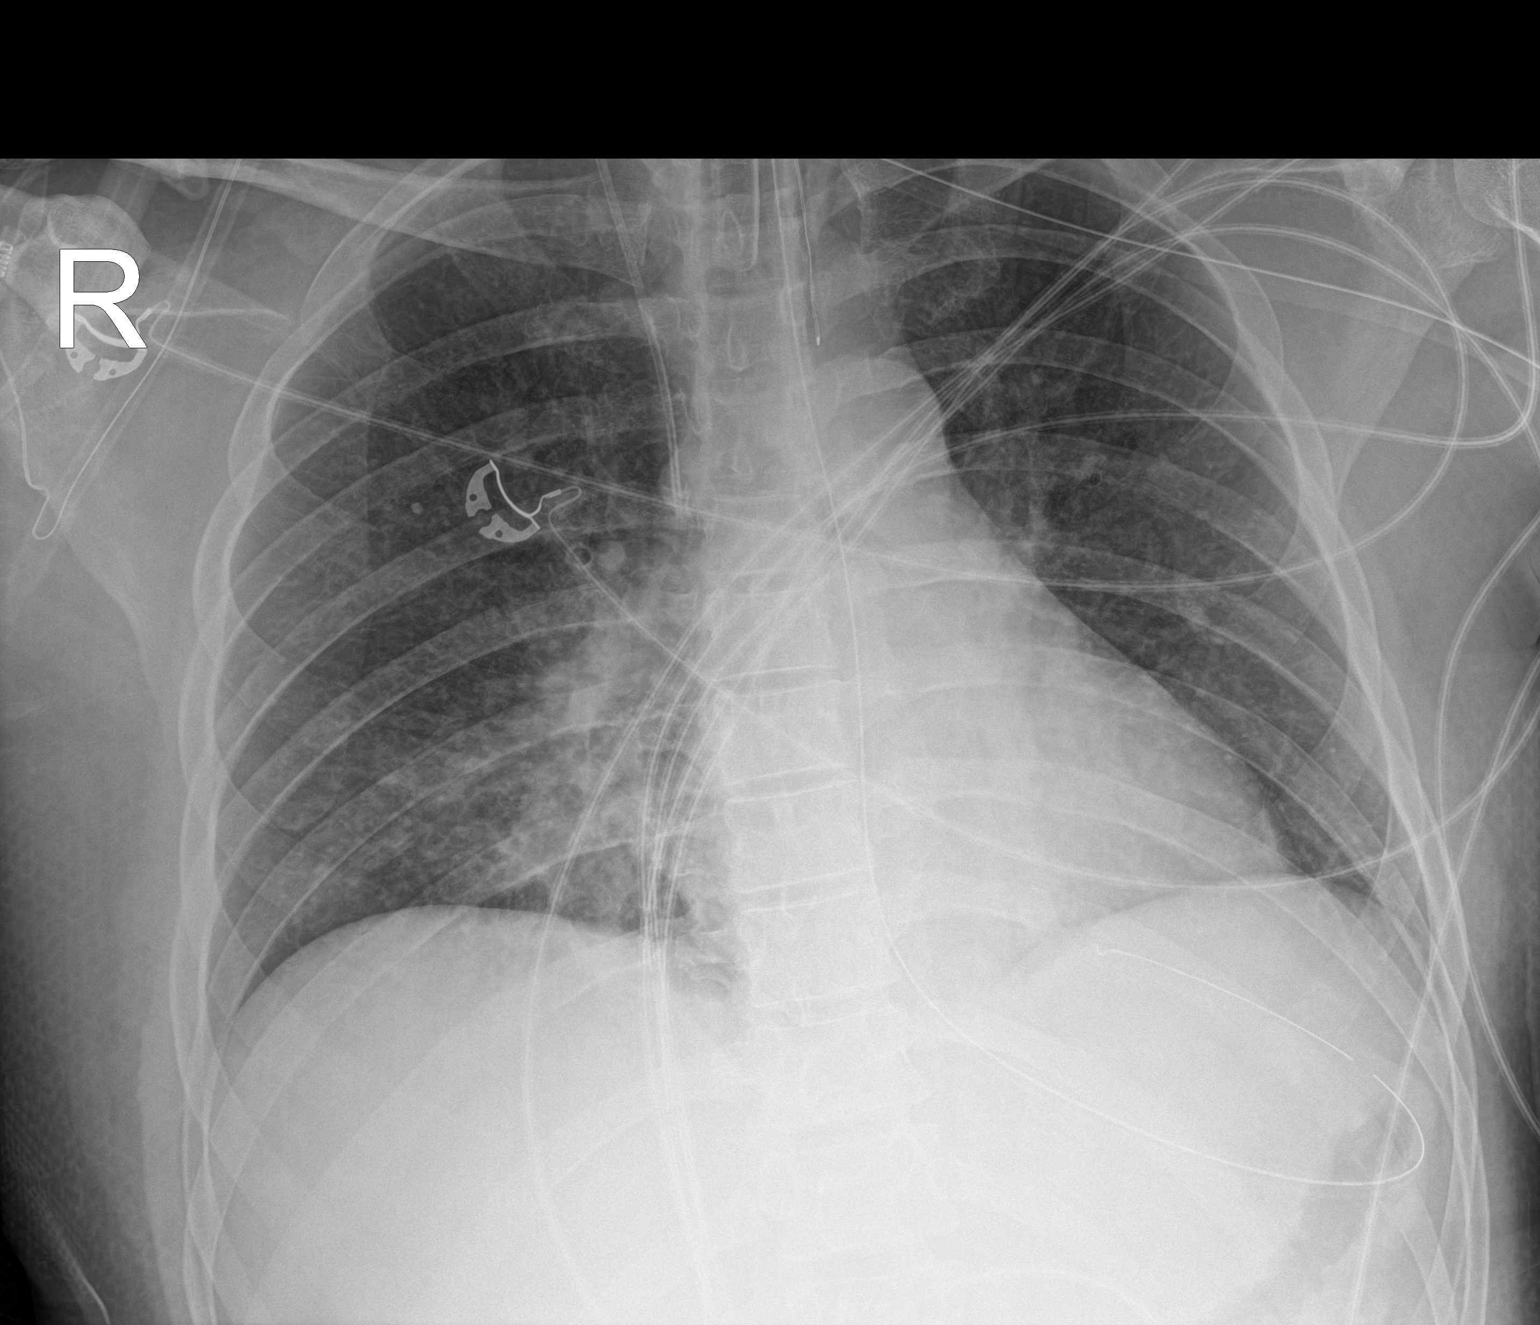

[1 of 1 positions shown; findings below may reference images not displayed]

FINDINGS: Enteric tube, endotracheal tube unchanged. Right IJ central venous
catheter with tip over the SVC.

Lungs are adequately inflated with persistent airspace opacification
over the right infrahilar region/medial right base. No effusion. No
pneumothorax. Cardiomediastinal silhouette and remainder of the exam
is unchanged.
IMPRESSION: Persistent airspace process over the medial right base/infrahilar
region.

Tubes and lines as described.

## 2020-07-13 IMAGING — DX DG CHEST 1V PORT
1 series · 1 of 1 positions shown · non-contrast
Comparison: Chest x-ray 10/06/2018.

CLINICAL DATA: 27-year-old male with history of respiratory failure
and hypoxia.

EXAM:
PORTABLE CHEST 1 VIEW

[chest ap]
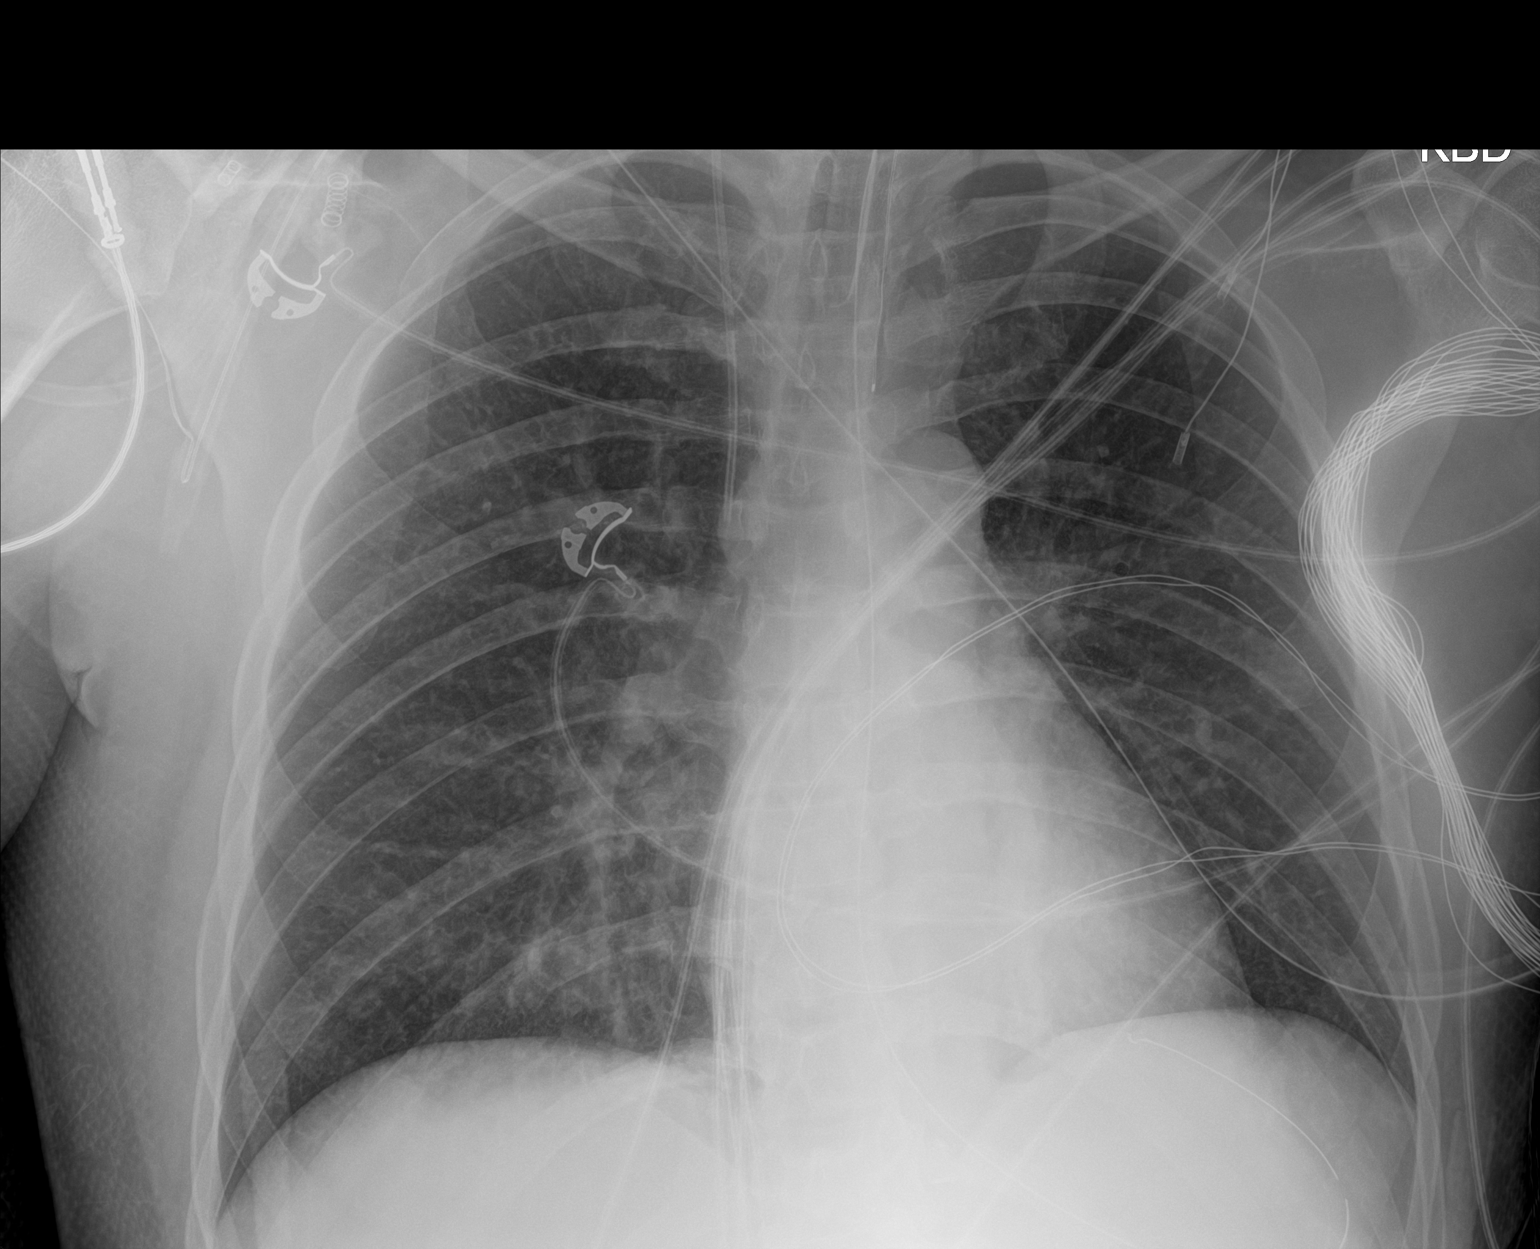

[1 of 1 positions shown; findings below may reference images not displayed]

FINDINGS: An endotracheal tube is in place with tip 7.4 cm above the carina.
There is a right-sided internal jugular central venous catheter with
tip terminating in the proximal superior vena cava. Nasogastric tube
extends into the stomach, curled retrograde with tip adjacent to the
gastroesophageal junction. Lung volumes are normal. No consolidative
airspace disease. No pleural effusions. No pneumothorax. No
pulmonary nodule or mass noted. Pulmonary vasculature and the
cardiomediastinal silhouette are within normal limits.
IMPRESSION: 1. Support apparatus, as above.
2. Compared to prior study from 10/06/2018 the multifocal airspace
disease seen on the prior examination has resolved.

## 2020-07-15 IMAGING — DX DG CHEST 1V PORT
1 series · 1 of 1 positions shown · non-contrast
Comparison: 10/08/2018.

CLINICAL DATA: Intubation.  Respiratory failure.

EXAM:
PORTABLE CHEST 1 VIEW

[chest ap]
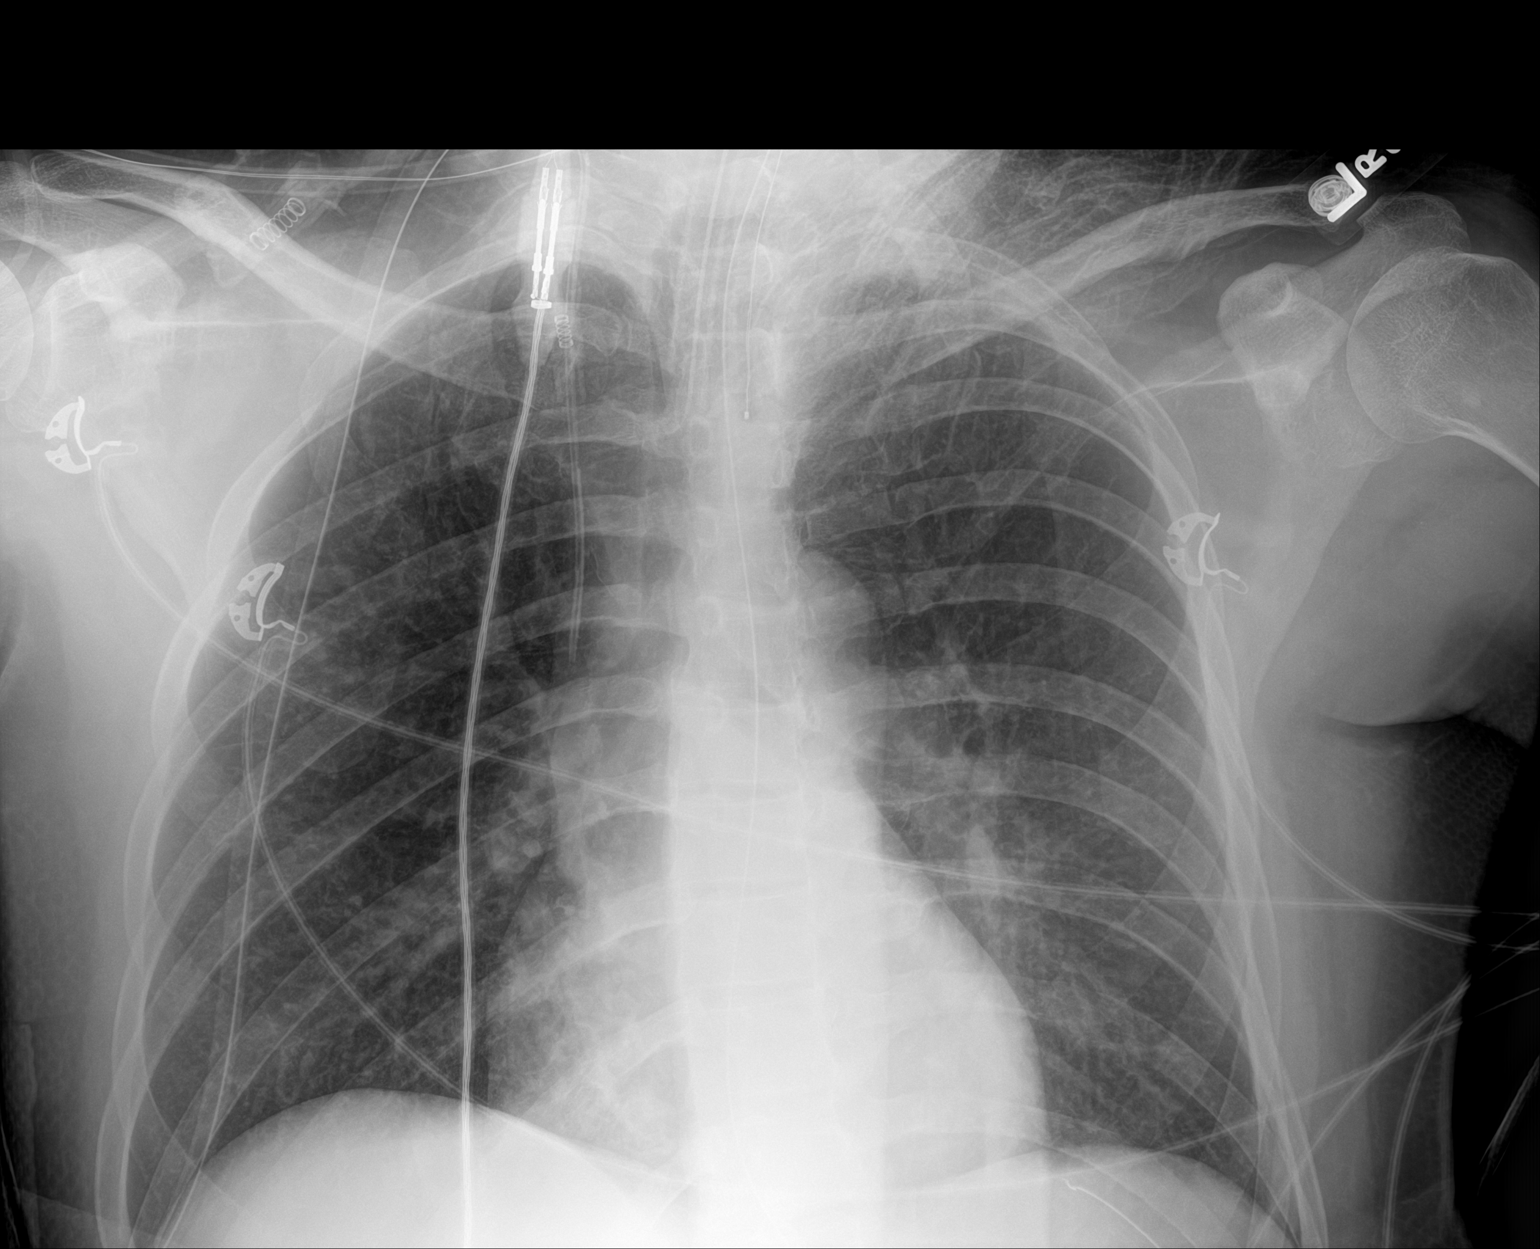

[1 of 1 positions shown; findings below may reference images not displayed]

FINDINGS: Endotracheal tube, NG tube, right IJ line stable position. Heart
size normal. Partial clearing of right base infiltrate. New left
base mild infiltrate. No pleural effusion or pneumothorax. Right
costophrenic angle incompletely imaged. No acute bony abnormality
identified.
IMPRESSION: 1.  Lines and tubes stable position.

2. Partial clearing of right base infiltrate with mild residual. New
left base mild infiltrate.

## 2020-07-17 IMAGING — DX DG CHEST 1V PORT
1 series · 1 of 1 positions shown · non-contrast
Comparison: 10/10/2018

CLINICAL DATA: ETT, recheck central line placement

EXAM:
PORTABLE CHEST 1 VIEW

[chest ap]
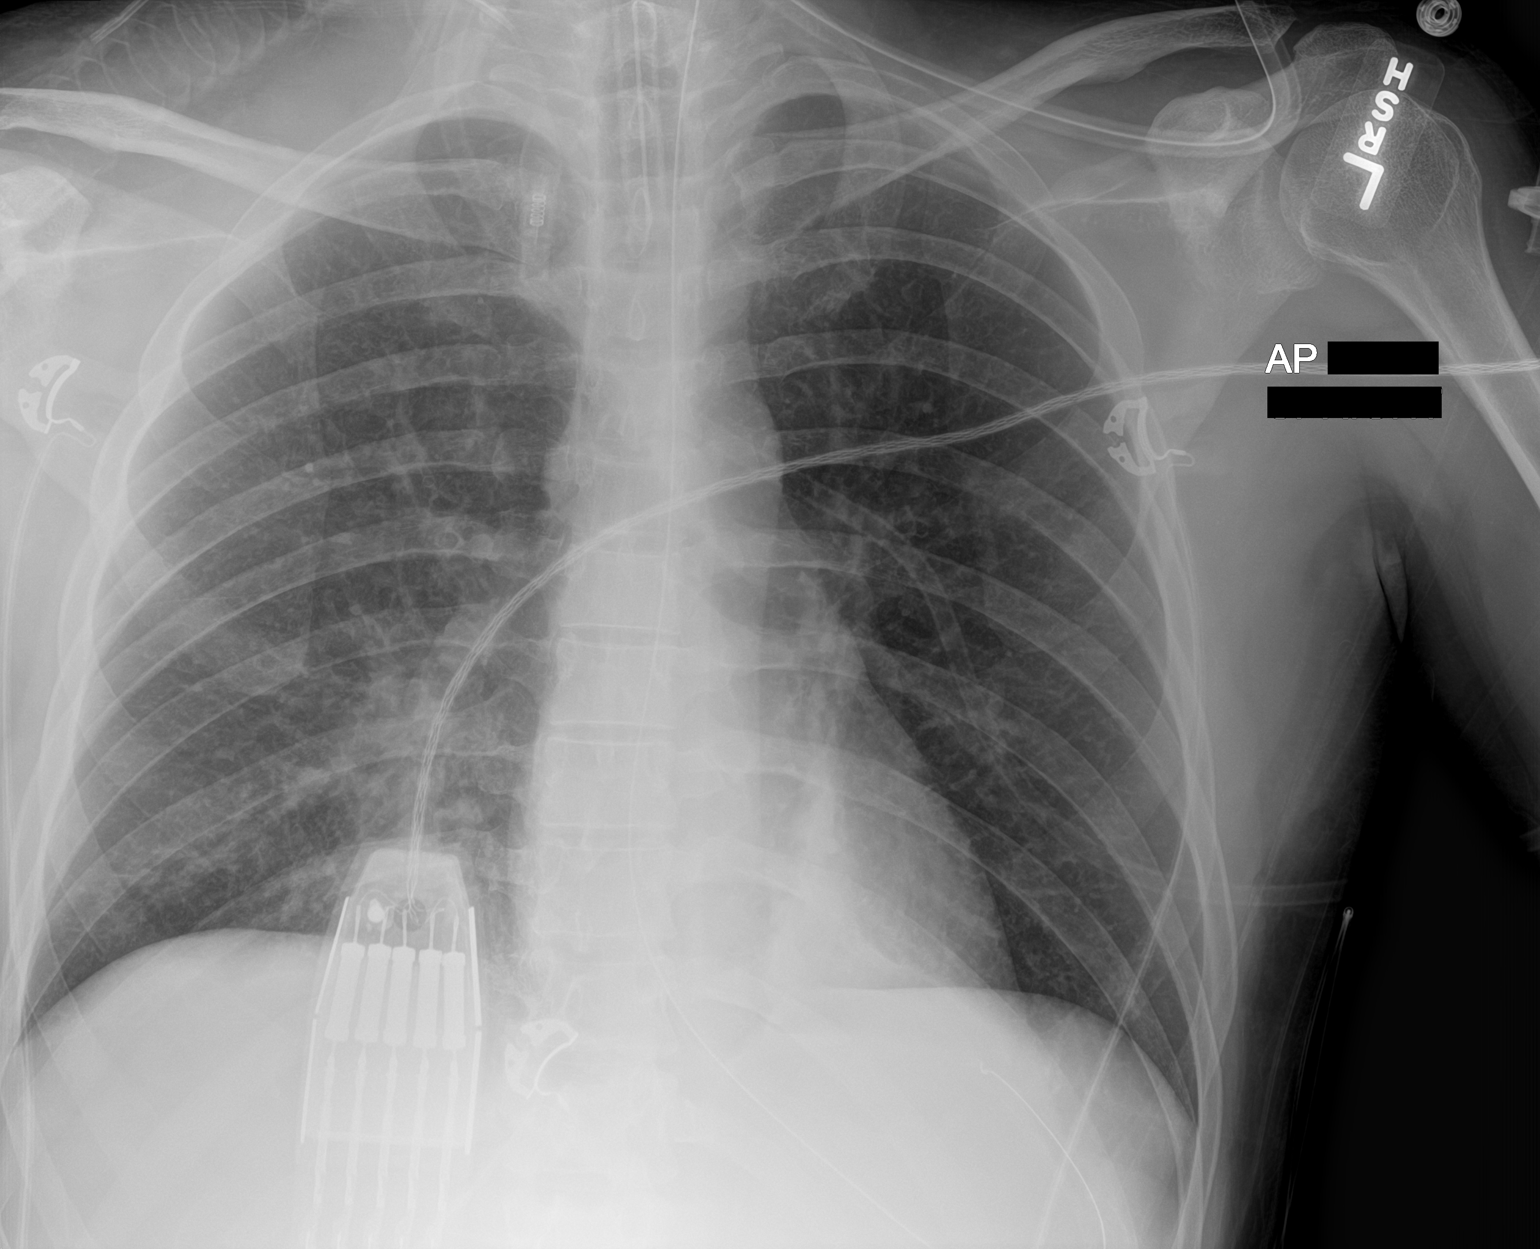

[1 of 1 positions shown; findings below may reference images not displayed]

FINDINGS: There has been substantial interval retraction of a right neck
vascular catheter, which now projects with tip in a peripheral
location over the soft tissues of the right neck, significantly
above the thoracic inlet. Endotracheal tube tip position is
unchanged below the thoracic inlet. Esophagogastric tube with tip
and side port below the diaphragm. Previously seen temperature probe
has been removed. Bandlike atelectasis or consolidation of the
retrocardiac left lung. No other airspace opacity. The heart and
mediastinum are normal.
IMPRESSION: 1. There has been substantial interval retraction of a right neck
vascular catheter, which now projects with tip in a peripheral
location over the soft tissues of the right neck, significantly
above the thoracic inlet.

2. Endotracheal tube tip position is unchanged below the thoracic
inlet. Esophagogastric tube with tip and side port below the
diaphragm. Previously seen temperature probe has been removed.

3. Bandlike atelectasis or consolidation of the retrocardiac left
lung. No other airspace opacity.

## 2020-07-18 IMAGING — DX DG CHEST 1V PORT
1 series · 1 of 1 positions shown · non-contrast
Comparison: Radiographs 10/12/2018 and 10/10/2018.  CT 05/27/2012.

CLINICAL DATA: Respiratory failure.  Ventilator patient.

EXAM:
PORTABLE CHEST 1 VIEW

[chest ap]
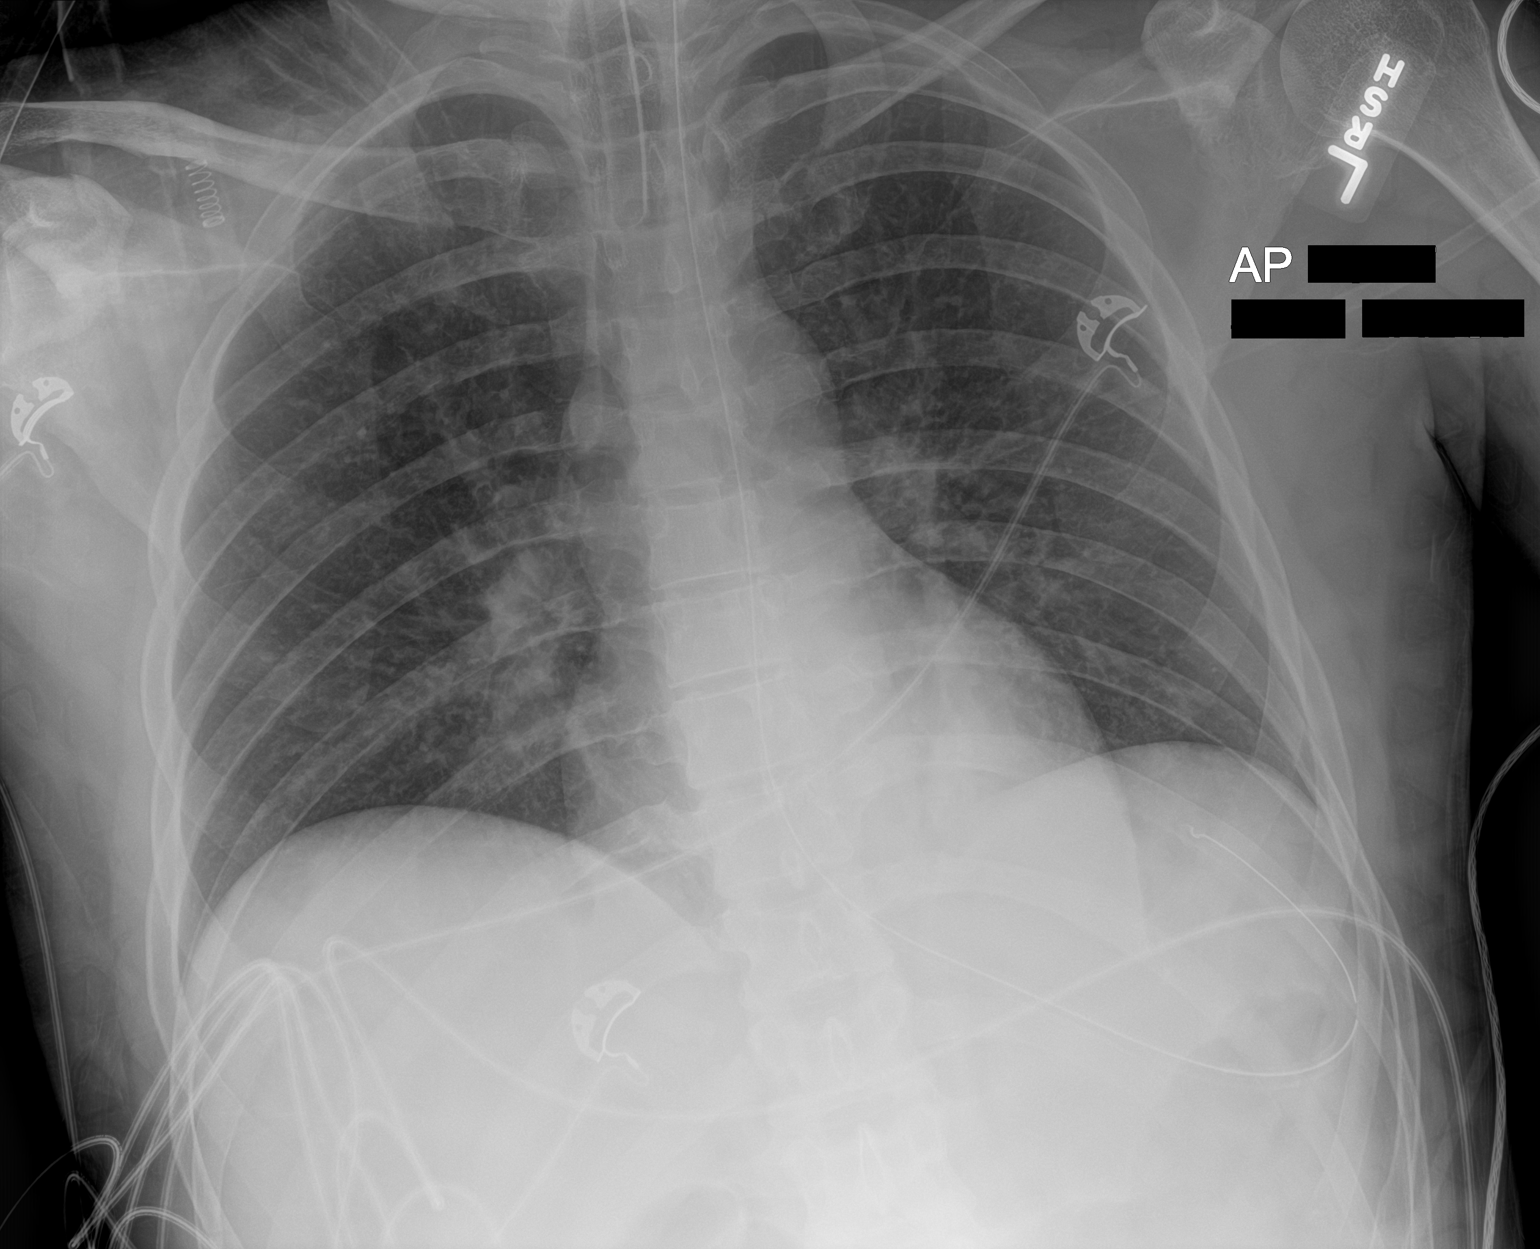

[1 of 1 positions shown; findings below may reference images not displayed]

FINDINGS: 9396 hours. The endotracheal and nasogastric tubes are unchanged. No
definite neck catheter visualized. The heart size and mediastinal
contours are stable. There are persistent band like opacities
medially in both lower lobes, improved from yesterday. There is no
new airspace disease, pneumothorax or significant pleural effusion.
The bones appear unchanged.
IMPRESSION: Interval improvement in bandlike lower lobe opacities bilaterally,
likely due to improving atelectasis. Stable support system.
